# Patient Record
Sex: Female | Born: 1975 | Race: Black or African American | Hispanic: No | Marital: Married | Smoking: Never smoker
Health system: Southern US, Community
[De-identification: ages and names within clinical notes are randomized; demographics above are authoritative.]

---

## 2017-11-20 ENCOUNTER — Encounter (HOSPITAL_COMMUNITY): Payer: Self-pay | Admitting: Emergency Medicine

## 2017-11-20 ENCOUNTER — Other Ambulatory Visit: Payer: Self-pay

## 2017-11-20 ENCOUNTER — Emergency Department (HOSPITAL_COMMUNITY)
Admission: EM | Admit: 2017-11-20 | Discharge: 2017-11-20 | Disposition: A | Discharge status: Home / Self Care | Payer: Self-pay | Attending: Emergency Medicine | Admitting: Emergency Medicine

## 2017-11-20 DIAGNOSIS — J0110 Acute frontal sinusitis, unspecified: Secondary | ICD-10-CM | POA: Insufficient documentation

## 2017-11-20 LAB — GROUP A STREP BY PCR: Group A Strep by PCR: NOT DETECTED

## 2017-11-20 MED ORDER — AMOXICILLIN-POT CLAVULANATE 875-125 MG PO TABS: 1.0000 | ORAL_TABLET | Freq: Two times a day (BID) | ORAL | 0 refills | Status: DC

## 2017-11-20 NOTE — ED Triage Notes (Signed)
Patient complains of headache, left ear ache, and sinus congestion x2 weeks. Patient report she has taken multiple OTC sinus medications without relief. Patient alert and oriented and in no apparent distress at this time.

## 2017-11-20 NOTE — ED Provider Notes (Signed)
[facility] EMERGENCY DEPARTMENT Provider Note   CSN: [id number] Arrival date & time: 11/20/17  1405     History   Chief Complaint Chief Complaint  Patient presents with  . URI    HPI Margaret Duncan is a 42 y.o. female who presents with URI symptoms.  No significant past medical history.  She states that she has had intermittent headaches, sore throat, bilateral ear pain, nasal congestion and runny nose for the past 2 weeks.  She does work at a daycare.  She is concerned she may have strep throat.  She has been taking over the counter medicines without significant relief.  She has had to leave work early several days because she does not feel well.  She also reports a cough.  She denies fever, shortness of breath, nausea or vomiting  HPI  History reviewed. No pertinent past medical history.  There are no active problems to display for this patient.   History reviewed. No pertinent surgical history.   OB History   None      Home Medications    Prior to Admission medications   Not on File    Family History No family history on file.  Social History Social History   Tobacco Use  . Smoking status: Not on file  Substance Use Topics  . Alcohol use: Not on file  . Drug use: Not on file     Allergies   Patient has no known allergies.   Review of Systems Review of Systems  Constitutional: Positive for fatigue. Negative for fever.  HENT: Positive for congestion, ear pain, rhinorrhea and sore throat.   Respiratory: Positive for cough. Negative for shortness of breath.      Physical Exam Updated Vital Signs BP 106/73 (BP Location: Right Arm)   Pulse 89   Temp 99.5 F (37.5 C) (Oral)   Resp 16   LMP  (Within Weeks)   SpO2 100%   Physical Exam  Constitutional: She is oriented to person, place, and time. She appears well-developed and well-nourished. No distress.  HENT:  Head: Normocephalic and atraumatic.  Right Ear: Hearing,  tympanic membrane, external ear and ear canal normal.  Left Ear: Hearing, tympanic membrane, external ear and ear canal normal.  Nose: Mucosal edema present.  Mouth/Throat: Uvula is midline and mucous membranes are normal. Posterior oropharyngeal erythema present. No posterior oropharyngeal edema.  Eyes: Pupils are equal, round, and reactive to light. Conjunctivae are normal. Right eye exhibits no discharge. Left eye exhibits no discharge. No scleral icterus.  Neck: Normal range of motion.  Cardiovascular: Normal rate and regular rhythm.  Pulmonary/Chest: Breath sounds normal. She is in respiratory distress.  Abdominal: She exhibits no distension.  Neurological: She is alert and oriented to person, place, and time.  Skin: Skin is warm and dry.  Psychiatric: She has a normal mood and affect. Her behavior is normal.  Nursing note and vitals reviewed.    ED Treatments / Results  Labs (all labs ordered are listed, but only abnormal results are displayed) Labs Reviewed  GROUP A STREP BY PCR    EKG None  Radiology No results found.  Procedures Procedures (including critical care time)  Medications Ordered in ED Medications - No data to display   Initial Impression / Assessment and Plan / ED Course  I have reviewed the triage vital signs and the nursing notes.  Pertinent labs & imaging results that were available during my care of the patient were reviewed by  me and considered in my medical decision making (see chart for details).  42 year old female presents with URI symptoms and sinus congestion for the past 2 weeks.  Her vital signs are normal.  She is overall well-appearing.  Her strep is negative. Will prescribe Augmentin due to her duration of symptoms and advised to continue over-the-counter medicines.  Return precautions were given.  Final Clinical Impressions(s) / ED Diagnoses   Final diagnoses:  None    ED Discharge Orders    None       Bethel Born,  PA-C 11/20/17 1756    Loren Racer, MD 11/24/17 1310

## 2017-11-20 NOTE — Discharge Instructions (Signed)
Take Augmentin with food for the next week.  Continue OTC cough/cold medicines Return if worsening

## 2017-11-20 NOTE — ED Notes (Signed)
Pt verbalized understanding discharge instructions and denies any further needs or questions at this time. VS stable, ambulatory and steady gait.   

## 2019-03-16 ENCOUNTER — Other Ambulatory Visit: Payer: Self-pay | Admitting: Obstetrics & Gynecology

## 2019-03-16 DIAGNOSIS — N6310 Unspecified lump in the right breast, unspecified quadrant: Secondary | ICD-10-CM

## 2019-03-23 ENCOUNTER — Other Ambulatory Visit: Payer: Self-pay

## 2019-03-23 ENCOUNTER — Ambulatory Visit
Admission: RE | Admit: 2019-03-23 | Discharge: 2019-03-23 | Disposition: A | Discharge status: Home / Self Care | Payer: Managed Care, Other (non HMO) | Source: Ambulatory Visit | Attending: Obstetrics & Gynecology | Admitting: Obstetrics & Gynecology

## 2019-03-23 DIAGNOSIS — N6310 Unspecified lump in the right breast, unspecified quadrant: Secondary | ICD-10-CM

## 2019-09-01 ENCOUNTER — Ambulatory Visit: Payer: Medicaid Other | Attending: Internal Medicine

## 2019-09-01 DIAGNOSIS — Z23 Encounter for immunization: Secondary | ICD-10-CM

## 2019-09-01 NOTE — Progress Notes (Signed)
   Covid-19 Vaccination Clinic  Name:  Margaret Duncan    MRN: [id number] DOB: 13-Feb-1976  09/01/2019  [name] was observed post Covid-19 immunization for 15 minutes without incidence. She was provided with Vaccine Information Sheet and instruction to access the V-Safe system.   [name] was instructed to call 911 with any severe reactions post vaccine: Marland Kitchen Difficulty breathing  . Swelling of your face and throat  . A fast heartbeat  . A bad rash all over your body  . Dizziness and weakness    Immunizations Administered    Name Date Dose VIS Date Route   Pfizer COVID-19 Vaccine 09/01/2019 10:02 AM 0.3 mL 06/15/2019 Intramuscular   Manufacturer: [employer], [employer]   Lot: [id number]   NDC: [address]-3

## 2019-09-22 ENCOUNTER — Ambulatory Visit: Payer: Medicaid Other

## 2019-09-25 ENCOUNTER — Ambulatory Visit: Payer: Medicaid Other | Attending: Internal Medicine

## 2019-09-25 DIAGNOSIS — Z23 Encounter for immunization: Secondary | ICD-10-CM

## 2019-09-25 NOTE — Progress Notes (Signed)
   Covid-19 Vaccination Clinic  Name:  Carrye Goller    MRN: [id number] DOB: Dec 08, 1975  09/25/2019  Ms. Brazel was observed post Covid-19 immunization for 15 minutes without incident. She was provided with Vaccine Information Sheet and instruction to access the V-Safe system.   Ms. Lampkins was instructed to call 911 with any severe reactions post vaccine: Marland Kitchen Difficulty breathing  . Swelling of face and throat  . A fast heartbeat  . A bad rash all over body  . Dizziness and weakness   Immunizations Administered    Name Date Dose VIS Date Route   Pfizer COVID-19 Vaccine 09/25/2019  4:57 PM 0.3 mL 06/15/2019 Intramuscular   Manufacturer: [employer], [employer]   Lot: [id number]   NDC: [address]-6

## 2019-09-26 ENCOUNTER — Ambulatory Visit: Payer: Medicaid Other

## 2019-10-02 ENCOUNTER — Telehealth: Payer: Self-pay

## 2019-10-02 ENCOUNTER — Other Ambulatory Visit: Payer: Self-pay

## 2019-10-02 ENCOUNTER — Emergency Department (HOSPITAL_COMMUNITY)
Admission: EM | Admit: 2019-10-02 | Discharge: 2019-10-02 | Disposition: A | Discharge status: Home / Self Care | Payer: BC Managed Care – PPO | Attending: Emergency Medicine | Admitting: Emergency Medicine

## 2019-10-02 ENCOUNTER — Encounter (HOSPITAL_COMMUNITY): Payer: Self-pay | Admitting: Emergency Medicine

## 2019-10-02 DIAGNOSIS — E860 Dehydration: Secondary | ICD-10-CM | POA: Diagnosis not present

## 2019-10-02 DIAGNOSIS — R197 Diarrhea, unspecified: Secondary | ICD-10-CM | POA: Insufficient documentation

## 2019-10-02 DIAGNOSIS — R110 Nausea: Secondary | ICD-10-CM | POA: Insufficient documentation

## 2019-10-02 DIAGNOSIS — R519 Headache, unspecified: Secondary | ICD-10-CM | POA: Diagnosis not present

## 2019-10-02 LAB — CBC WITH DIFFERENTIAL/PLATELET
Abs Immature Granulocytes: 0.04 10*3/uL (ref 0.00–0.07)
Basophils Absolute: 0.1 10*3/uL (ref 0.0–0.1)
Basophils Relative: 0 %
Eosinophils Absolute: 0.3 10*3/uL (ref 0.0–0.5)
Eosinophils Relative: 3 %
HCT: 38.7 % (ref 36.0–46.0)
Hemoglobin: 12.5 g/dL (ref 12.0–15.0)
Immature Granulocytes: 0 %
Lymphocytes Relative: 35 %
Lymphs Abs: 4 10*3/uL (ref 0.7–4.0)
MCH: 30.3 pg (ref 26.0–34.0)
MCHC: 32.3 g/dL (ref 30.0–36.0)
MCV: 93.9 fL (ref 80.0–100.0)
Monocytes Absolute: 0.7 10*3/uL (ref 0.1–1.0)
Monocytes Relative: 7 %
Neutro Abs: 6.2 10*3/uL (ref 1.7–7.7)
Neutrophils Relative %: 55 %
Platelets: 251 10*3/uL (ref 150–400)
RBC: 4.12 MIL/uL (ref 3.87–5.11)
RDW: 13.2 % (ref 11.5–15.5)
WBC: 11.4 10*3/uL — ABNORMAL HIGH (ref 4.0–10.5)
nRBC: 0 % (ref 0.0–0.2)

## 2019-10-02 LAB — URINALYSIS, ROUTINE W REFLEX MICROSCOPIC
Glucose, UA: NEGATIVE mg/dL
Ketones, ur: 20 mg/dL — AB
Leukocytes,Ua: NEGATIVE
Nitrite: NEGATIVE
Protein, ur: 100 mg/dL — AB
Specific Gravity, Urine: 1.035 — ABNORMAL HIGH (ref 1.005–1.030)
pH: 5 (ref 5.0–8.0)

## 2019-10-02 LAB — BASIC METABOLIC PANEL
Anion gap: 12 (ref 5–15)
BUN: 7 mg/dL (ref 6–20)
CO2: 23 mmol/L (ref 22–32)
Calcium: 9.8 mg/dL (ref 8.9–10.3)
Chloride: 103 mmol/L (ref 98–111)
Creatinine, Ser: 0.8 mg/dL (ref 0.44–1.00)
GFR calc Af Amer: >60 mL/min (ref >60)
GFR calc non Af Amer: >60 mL/min (ref >60)
Glucose, Bld: 110 mg/dL — ABNORMAL HIGH (ref 70–99)
Potassium: 3.7 mmol/L (ref 3.5–5.1)
Sodium: 138 mmol/L (ref 135–145)

## 2019-10-02 LAB — I-STAT BETA HCG BLOOD, ED (MC, WL, AP ONLY): I-stat hCG, quantitative: <5 m[IU]/mL (ref <5)

## 2019-10-02 MED ORDER — DICYCLOMINE HCL 20 MG PO TABS: 20.0000 mg | ORAL_TABLET | Freq: Two times a day (BID) | ORAL | 0 refills | Status: DC | PRN

## 2019-10-02 MED ORDER — ONDANSETRON 4 MG PO TBDP: 4.0000 mg | ORAL_TABLET | Freq: Three times a day (TID) | ORAL | 0 refills | Status: DC | PRN

## 2019-10-02 MED ORDER — DICYCLOMINE HCL 10 MG PO CAPS
10.0000 mg | ORAL_CAPSULE | Freq: Once | ORAL | Status: AC
  Administered 2019-10-02: 06:00:00 10 mg via ORAL
  Filled 2019-10-02: qty 1

## 2019-10-02 MED ORDER — ONDANSETRON HCL 4 MG/2ML IJ SOLN
4.0000 mg | Freq: Once | INTRAMUSCULAR | Status: AC
  Administered 2019-10-02: 4 mg via INTRAVENOUS
  Filled 2019-10-02: qty 2

## 2019-10-02 MED ORDER — LACTATED RINGERS IV BOLUS
1000.0000 mL | Freq: Once | INTRAVENOUS | Status: AC
  Administered 2019-10-02: 1000 mL via INTRAVENOUS

## 2019-10-02 NOTE — ED Triage Notes (Signed)
Patient reports poor appetite , fatigue , low energy and headache this week , currently taking oral antibiotic post dental extraction . Denies fever or chills .

## 2019-10-02 NOTE — Telephone Encounter (Signed)
Pharmacy called to clarify prescription of zofran written by Dr Clayborne Dana. Clarification from notes zofran Q 8 h PRN

## 2019-10-03 LAB — URINE CULTURE: Culture: NO GROWTH

## 2019-10-03 NOTE — ED Provider Notes (Signed)
[facility] EMERGENCY DEPARTMENT Provider Note   CSN: [id number] Arrival date & time: 10/02/19  0126     History Chief Complaint  Patient presents with  . Loss of appetite/Fatigue/Headache    Abimbola Aki is a 44 y.o. female.  Patient with a recent dental surgery and since that time she has been Augmentin.  She states that she has had a decreased appetite, nausea some diarrhea.  She this is caused her to have headaches as well.  She feels like she might be dehydrated as her mouth seems to be dry.  She never had Augmentin before that she knows of.  She states that the dental pain seems to have improved but she does have intermittent headaches.  Has not tried thing at home for the symptoms.  Has abdominal cramping but no focal pain.        History reviewed. No pertinent past medical history.  There are no problems to display for this patient.   History reviewed. No pertinent surgical history.   OB History   No obstetric history on file.     Family History  Problem Relation Age of Onset  . Breast cancer Paternal Aunt   . Breast cancer Paternal Grandmother   . Breast cancer Paternal Aunt     Social History   Tobacco Use  . Smoking status: Never Smoker  . Smokeless tobacco: Never Used  Substance Use Topics  . Alcohol use: Never  . Drug use: Never    Home Medications Prior to Admission medications   Medication Sig Start Date End Date Taking? Authorizing Provider  amoxicillin-clavulanate (AUGMENTIN) 875-125 MG tablet Take 1 tablet by mouth 2 (two) times daily. 09/28/19  Yes [provider]  chlorhexidine (PERIDEX) 0.12 % solution 15 mLs by Mouth Rinse route 2 (two) times daily. X 7 days 09/28/19  Yes [provider]  HYDROcodone-acetaminophen (NORCO) 7.5-325 MG tablet Take 1 tablet by mouth every 4 (four) hours as needed for pain. 09/28/19  Yes [provider]  ibuprofen (ADVIL) 800 MG tablet Take 800 mg by mouth every 8  (eight) hours as needed for pain. 09/28/19  Yes [provider]  amoxicillin-clavulanate (AUGMENTIN) 875-125 MG tablet Take 1 tablet by mouth every 12 (twelve) hours. Patient not taking: Reported on 10/02/2019 11/20/17   Bethel Born, PA-C  dicyclomine (BENTYL) 20 MG tablet Take 1 tablet (20 mg total) by mouth 2 (two) times daily as needed for spasms (abdominal cramping). 10/02/19   Vashawn Ekstein, Barbara Cower, MD  ondansetron (ZOFRAN ODT) 4 MG disintegrating tablet Take 1 tablet (4 mg total) by mouth every 8 (eight) hours as needed. 4mg  ODT q4 hours prn nausea/vomit 10/02/19   Woodrow Drab, 10/04/19, MD    Allergies    Patient has no known allergies.  Review of Systems   Review of Systems  All other systems reviewed and are negative.   Physical Exam Updated Vital Signs BP (!) 103/58   Pulse 70   Temp 97.6 F (36.4 C) (Oral)   Resp 18   Ht 5\' 3"  (1.6 m)   Wt 55 kg   LMP 09/25/2019   SpO2 100%   BMI 21.48 kg/m   Physical Exam Vitals and nursing note reviewed.  Constitutional:      Appearance: She is well-developed.  HENT:     Head: Normocephalic and atraumatic.     Mouth/Throat:     Mouth: Mucous membranes are dry.     Pharynx: Oropharynx is clear.  Eyes:  Conjunctiva/sclera: Conjunctivae normal.  Cardiovascular:     Rate and Rhythm: Normal rate and regular rhythm.     Heart sounds: No murmur.  Pulmonary:     Effort: No respiratory distress.     Breath sounds: No stridor.  Abdominal:     General: There is no distension.  Musculoskeletal:        General: No swelling or tenderness. Normal range of motion.     Cervical back: Normal range of motion.  Skin:    General: Skin is warm and dry.  Neurological:     General: No focal deficit present.     Mental Status: She is alert.     ED Results / Procedures / Treatments   Labs (all labs ordered are listed, but only abnormal results are displayed) Labs Reviewed  CBC WITH DIFFERENTIAL/PLATELET - Abnormal; Notable for the  following components:      Result Value   WBC 11.4 (*)    All other components within normal limits  BASIC METABOLIC PANEL - Abnormal; Notable for the following components:   Glucose, Bld 110 (*)    All other components within normal limits  URINALYSIS, ROUTINE W REFLEX MICROSCOPIC - Abnormal; Notable for the following components:   APPearance HAZY (*)    Specific Gravity, Urine 1.035 (*)    Hgb urine dipstick SMALL (*)    Bilirubin Urine SMALL (*)    Ketones, ur 20 (*)    Protein, ur 100 (*)    Bacteria, UA RARE (*)    All other components within normal limits  URINE CULTURE  I-STAT BETA HCG BLOOD, ED ([facility], [facility], [facility] ONLY)    EKG None  Radiology No results found.  Procedures Procedures (including critical care time)  Medications Ordered in ED Medications  ondansetron (ZOFRAN) injection 4 mg (4 mg Intravenous Given 10/02/19 0617)  lactated ringers bolus 1,000 mL (0 mLs Intravenous Stopped 10/02/19 0741)  dicyclomine (BENTYL) capsule 10 mg (10 mg Oral Given 10/02/19 0617)    ED Course  I have reviewed the triage vital signs and the nursing notes.  Pertinent labs & imaging results that were available during my care of the patient were reviewed by me and considered in my medical decision making (see chart for details).    MDM Rules/Calculators/A&P                      Suspect that this is side effect of the Augmentin.  She was given some fluids here she did seem to be a little bit dehydrated with a high specific gravity and urine.  No indication for imaging is without focal tenderness of her abdomen I doubt appendicitis, obstruction, colitis or other significant intra-abdominal emergency.  Plan for rehydration and discharge. Continue augmentin with meals, zofran and bentyl 1/2 hour prior.   Final Clinical Impression(s) / ED Diagnoses Final diagnoses:  Dehydration    Rx / DC Orders ED Discharge Orders         Ordered    dicyclomine (BENTYL) 20 MG tablet  2 times daily PRN      10/02/19 0647    ondansetron (ZOFRAN ODT) 4 MG disintegrating tablet  Every 8 hours PRN     10/02/19 0647           Cosby Proby, Corene Cornea, MD 10/03/19 6948

## 2019-10-15 DIAGNOSIS — R1084 Generalized abdominal pain: Secondary | ICD-10-CM | POA: Diagnosis not present

## 2019-10-15 DIAGNOSIS — K5900 Constipation, unspecified: Secondary | ICD-10-CM | POA: Diagnosis not present

## 2019-10-31 ENCOUNTER — Ambulatory Visit (INDEPENDENT_AMBULATORY_CARE_PROVIDER_SITE_OTHER): Payer: BC Managed Care – PPO | Admitting: Primary Care

## 2019-10-31 ENCOUNTER — Other Ambulatory Visit: Payer: Self-pay

## 2019-10-31 ENCOUNTER — Encounter (INDEPENDENT_AMBULATORY_CARE_PROVIDER_SITE_OTHER): Payer: Self-pay | Admitting: Primary Care

## 2019-10-31 VITALS — BP 112/66 | HR 104 | Temp 97.5°F | Ht 63.0 in | Wt 112.6 lb

## 2019-10-31 DIAGNOSIS — Z23 Encounter for immunization: Secondary | ICD-10-CM

## 2019-10-31 DIAGNOSIS — Z7689 Persons encountering health services in other specified circumstances: Secondary | ICD-10-CM

## 2019-10-31 DIAGNOSIS — F5082 Avoidant/restrictive food intake disorder: Secondary | ICD-10-CM | POA: Diagnosis not present

## 2019-10-31 DIAGNOSIS — K5900 Constipation, unspecified: Secondary | ICD-10-CM

## 2019-10-31 MED ORDER — SENNA 8.6 MG PO TABS: 1.0000 | ORAL_TABLET | Freq: Every day | ORAL | 0 refills | Status: DC | PRN

## 2019-10-31 NOTE — Patient Instructions (Addendum)
Anorexia Nervosa Anorexia nervosa is an eating disorder that results in a lower-than-normal body weight. The disorder usually starts in the teenage years. People with anorexia nervosa are intensely afraid of gaining weight or being fat. They often see themselves as fat even though they may be very thin. To prevent weight gain or to lose weight, they engage in unhealthy behaviors, such as:  Starving themselves.  Fasting.  Exercising too much.  Trying to get rid of food they have eaten (purging), such as by: ? Making themselves throw up (vomit) after eating. ? Using laxatives or enemas. These behaviors often interfere with normal life activities. They can lead to serious medical problems and even death. People with anorexia nervosa are also at risk for substance abuse and death due to suicide. What are the causes? Common causes of this condition include:  Depression and other psychological problems.  Pressure from peers and society to have a thin body.  Hormone changes at puberty.  Stress.  Factors that are inherited from family (genetics). What increases the risk? The following factors may make you more likely to develop this condition:  Being a teenager.  Being female. Anorexia nervosa can affect males, but it is more common in females.  Having a mental health disorder, such as depression or anxiety.  Having a family member who has an eating disorder.  Participating in sports or activities that put an emphasis on weight and appearance, such as dancing, cheerleading, running, ice-skating, gymnastics, wrestling, or modeling.  Feeling anxious or tending to be obsessive, even as a young child.  Thinking a lot about being perfect and following rules. What are the signs or symptoms? Symptoms of this condition include:  Changes in appearance. These may include hair loss, dry hair and skin, spotty skin, brittle nails, and a thin layer of hair covering the skin  (lanugo).  Discolored teeth or cavities.  Loss of muscle and fat.  Lower-than-normal body weight.  Low blood pressure.  Slow heart rate.  Feeling cold all the time.  Fatigue.  Constipation.  Missed menstrual periods. Other symptoms include:  An intense fear of gaining weight or being fat.  Having a distorted body image, such as thinking that you are fat when you are not.  Basing your self-worth on being thin.  Wearing lots of clothes to hide your body.  Being in denial that your low body weight is a problem.  Eating in secret.  Binge eating.  Checking your body frequently by: ? Looking in a mirror. ? Pinching the skin on the sides of your body. ? Weighing yourself.  Doing things that prevent weight gain, such as: ? Limiting (restricting) your calorie intake, starving yourself, or not eating for a long period of time (fasting). ? Purging. ? Exercising excessively. How is this diagnosed? This condition may be diagnosed based on:  An assessment by your health care provider. He or she may ask about: ? Your thoughts, feelings, and eating habits. ? Your symptoms. ? Your use of medicine, alcohol, or other substances.  Measurements of your weight.  Checking your body temperature, pulse, blood pressure, and breathing rate (vital signs).  Results of a physical exam. Your health care provider may also order tests or studies to look for health problems. You may be referred to a mental health specialist for evaluation. After you have been diagnosed, your level of anorexia nervosa will be rated from mild to severe. The rating depends on your degree of weight loss compared to other people  of your age, gender, and stage of development. How is this treated?  The first goal of treatment is to stabilize medical problems and mental health issues that are related to the disorder. The type and length of treatment will depend on your specific situation. You may need to be  treated at the hospital if you have:  Severe malnutrition.  Dehydration.  An imbalance in important chemicals (electrolytes) in your body.  An abnormal heart rhythm.  Depression.  Suicidal thoughts. The second goal of treatment is to restore your body weight to a healthy level. Successful treatment usually requires a combination of:  Close monitoring of weight and feeding (behavioral feeding plan) to increase your calorie intake.  Counseling with a diet and nutrition specialist (dietitian).  Talk therapy or counseling with a mental health specialist. A form of talk therapy called cognitive behavioral therapy (CBT) can be especially helpful. This therapy helps you recognize the thoughts, beliefs, and emotions that lead to unhealthy eating habits and helps you change them.  Medicines. Some people with severe anorexia nervosa may benefit from certain medicines that help them to gain weight. Antidepressant medicines can help with symptoms of depression and anxiety. For adolescents, Family-Based Treatment (FBT) may also be helpful. This is a form of therapy in which your family is invited to have an active role in your treatment and recovery. Follow these instructions at home:  Take over-the-counter and prescription medicines only as told by your health care provider.  Follow a meal plan as directed by your health care provider.  Avoid checking your weight.  Avoid isolating yourself from your family and friends, especially during mealtimes.  Keep all follow-up visits as told by your health care provider. This is important. Where to find more information  National Eating Disorders Association (NEDA): www.nationaleatingdisorders.[place] on Mental Illness: www.nami.org  [place] Department of Health and Human Services: http://greene.com/ Contact a health care provider if:  Your symptoms get worse.  You start having new symptoms. Get help right away if:  You fall  down (collapse) or lose consciousness (faint) because of exhaustion or malnutrition.  You have chest pain or abnormal heart rhythms.  You vomit blood.  You have serious thoughts about hurting yourself or someone else. If you ever feel like you may hurt yourself or others, or have thoughts about taking your own life, get help right away. You can go to your nearest emergency department or call:  Your local emergency services (911 in the [place]  A suicide crisis helpline, such as the [facility] at 929-261-1460. This is open 24 hours a day. Summary  Anorexia nervosa is a serious condition. Low body weight is its main symptom.  People who have anorexia nervosa often think that they are overweight (body image disturbance). They may even try to lose weight by denying themselves food, try to get rid of the food they have eaten (purge), or use certain medicines to prevent weight gain.  If anorexia nervosa is not treated, it can lead to death.  Effective treatments usually involve close monitoring of weight and feeding (behavioral feeding plans) and cognitive behavioral interventions. In some cases, treatment may include medicines. This information is not intended to replace advice given to you by your health care provider. Make sure you discuss any questions you have with your health care provider. Document Revised: 06/03/2017 Document Reviewed: 02/16/2017 Elsevier Patient Education  2020 [employer]. Fiber Chart  You should 25-30g of fiber per day and drinking  8 glasses of water to help your bowels move regularly.  In the chart below you can look up how much fiber you are getting in an average day.  If you are not getting enough fiber, you should add a fiber supplement to your diet.  Examples of this include Metamucil, FiberCon and Citrucel.  These can be purchased at your local grocery store or pharmacy.       LimitLaws.com.cy.pdf https://www.cdc.gov/vaccines/hcp/vis/vis-statements/tdap.pdf">  Tdap (Tetanus, Diphtheria, Pertussis) Vaccine: What You Need to Know 1. Why get vaccinated? Tdap vaccine can prevent tetanus, diphtheria, and pertussis. Diphtheria and pertussis spread from person to person. Tetanus enters the body through cuts or wounds.  TETANUS (T) causes painful stiffening of the muscles. Tetanus can lead to serious health problems, including being unable to open the mouth, having trouble swallowing and breathing, or death.  DIPHTHERIA (D) can lead to difficulty breathing, heart failure, paralysis, or death.  PERTUSSIS (aP), also known as "whooping cough," can cause uncontrollable, violent coughing which makes it hard to breathe, eat, or drink. Pertussis can be extremely serious in babies and young children, causing pneumonia, convulsions, brain damage, or death. In teens and adults, it can cause weight loss, loss of bladder control, passing out, and rib fractures from severe coughing. 2. Tdap vaccine Tdap is only for children 7 years and older, adolescents, and adults.  Adolescents should receive a single dose of Tdap, preferably at age 80 or 12 years. Pregnant women should get a dose of Tdap during every pregnancy, to protect the newborn from pertussis. Infants are most at risk for severe, life-threatening complications from pertussis. Adults who have never received Tdap should get a dose of Tdap. Also, adults should receive a booster dose every 10 years, or earlier in the case of a severe and dirty wound or burn. Booster doses can be either Tdap or Td (a different vaccine that protects against tetanus and diphtheria but not pertussis). Tdap may be given at the same time as other vaccines. 3. Talk with your health care provider Tell your vaccine provider if the person getting the vaccine:  Has had an allergic reaction after a  previous dose of any vaccine that protects against tetanus, diphtheria, or pertussis, or has any severe, life-threatening allergies.  Has had a coma, decreased level of consciousness, or prolonged seizures within 7 days after a previous dose of any pertussis vaccine (DTP, DTaP, or Tdap).  Has seizures or another nervous system problem.  Has ever had Guillain-Barr Syndrome (also called GBS).  Has had severe pain or swelling after a previous dose of any vaccine that protects against tetanus or diphtheria. In some cases, your health care provider may decide to postpone Tdap vaccination to a future visit.  People with minor illnesses, such as a cold, may be vaccinated. People who are moderately or severely ill should usually wait until they recover before getting Tdap vaccine.  Your health care provider can give you more information. 4. Risks of a vaccine reaction  Pain, redness, or swelling where the shot was given, mild fever, headache, feeling tired, and nausea, vomiting, diarrhea, or stomachache sometimes happen after Tdap vaccine. People sometimes faint after medical procedures, including vaccination. Tell your provider if you feel dizzy or have vision changes or ringing in the ears.  As with any medicine, there is a very remote chance of a vaccine causing a severe allergic reaction, other serious injury, or death. 5. What if there is a serious problem? An allergic reaction could occur after  the vaccinated person leaves the clinic. If you see signs of a severe allergic reaction (hives, swelling of the face and throat, difficulty breathing, a fast heartbeat, dizziness, or weakness), call 9-1-1 and get the person to the nearest hospital. For other signs that concern you, call your health care provider.  Adverse reactions should be reported to the Vaccine Adverse Event Reporting System (VAERS). Your health care provider will usually file this report, or you can do it yourself. Visit the VAERS  website at www.vaers.LAgents.no or call 6782238292. VAERS is only for reporting reactions, and VAERS staff do not give medical advice. 6. The National Vaccine Injury Compensation Program The [employer] Vaccine Injury Compensation Program (VICP) is a federal program that was created to compensate people who may have been injured by certain vaccines. Visit the VICP website at SpiritualWord.at or call (530)637-3640 to learn about the program and about filing a claim. There is a time limit to file a claim for compensation. 7. How can I learn more?  Ask your health care provider.  Call your local or state health department.  Contact the Centers for Disease Control and Prevention (CDC): ? Call (680)067-2967 (1-800-CDC-INFO) or ? Visit CDC's website at PicCapture.uy Vaccine Information Statement Tdap (Tetanus, Diphtheria, Pertussis) Vaccine (10/04/2018) This information is not intended to replace advice given to you by your health care provider. Make sure you discuss any questions you have with your health care provider. Document Revised: 10/13/2018 Document Reviewed: 10/16/2018 Elsevier Patient Education  2020 [employer].

## 2019-10-31 NOTE — Progress Notes (Signed)
New Patient Office Visit  Subjective:  Patient ID: Margaret Duncan, female    DOB: [birth year]  Age: 44 y.o. MRN: [id number]  CC:  Chief Complaint  Patient presents with  . New Patient (Initial Visit)    right leg pain  . Constipation  . Anemia  . Anorexia    HPI Ms. Sylwia Cuervo is a 44 year old African American female with no significant past medical history. She was seen in the emergency room 10/02/19 after dental surgery she was prescribed  Augmentin.   She had  decreased appetite,(previous but worst) nausea some diarrhea and   headaches. Presents to establish care. Continues to have no appetite , forces self to eat and with eating  No past medical history on file.  No past surgical history on file.  Family History  Problem Relation Age of Onset  . Breast cancer Paternal Aunt   . Breast cancer Paternal Grandmother   . Breast cancer Paternal Aunt     Social History   Socioeconomic History  . Marital status: Married    Spouse name: Not on file  . Number of children: Not on file  . Years of education: Not on file  . Highest education level: Not on file  Occupational History  . Not on file  Tobacco Use  . Smoking status: Never Smoker  . Smokeless tobacco: Never Used  Substance and Sexual Activity  . Alcohol use: Never  . Drug use: Never  . Sexual activity: Not on file  Other Topics Concern  . Not on file  Social History Narrative  . Not on file   Social Determinants of Health   Financial Resource Strain:   . Difficulty of Paying Living Expenses:   Food Insecurity:   . Worried About Programme researcher, broadcasting/film/video in the Last Year:   . Barista in the Last Year:   Transportation Needs:   . Freight forwarder (Medical):   Marland Kitchen Lack of Transportation (Non-Medical):   Physical Activity:   . Days of Exercise per Week:   . Minutes of Exercise per Session:   Stress:   . Feeling of Stress :   Social Connections:   . Frequency of Communication with Friends  and Family:   . Frequency of Social Gatherings with Friends and Family:   . Attends Religious Services:   . Active Member of Clubs or Organizations:   . Attends Banker Meetings:   Marland Kitchen Marital Status:   Intimate Partner Violence:   . Fear of Current or Ex-Partner:   . Emotionally Abused:   Marland Kitchen Physically Abused:   . Sexually Abused:     ROS Review of Systems  Constitutional: Positive for appetite change and fatigue.    Objective:   Today's Vitals: BP 112/66 (BP Location: Left Arm, Patient Position: Sitting, Cuff Size: Normal)   Pulse (!) 104   Temp (!) 97.5 F (36.4 C) (Temporal)   Ht 5\' 3"  (1.6 m)   Wt 112 lb 9.6 oz (51.1 kg)   LMP 10/16/2019 (Exact Date)   SpO2 98%   BMI 19.95 kg/m   Physical Exam Vitals reviewed.  Constitutional:      Appearance: Normal appearance.  HENT:     Head: Normocephalic.     Right Ear: Tympanic membrane normal.     Left Ear: Tympanic membrane normal.  Eyes:     Extraocular Movements: Extraocular movements intact.     Pupils: Pupils are equal, round, and reactive to  light.  Cardiovascular:     Rate and Rhythm: Normal rate and regular rhythm.     Pulses: Normal pulses.     Heart sounds: Normal heart sounds.  Pulmonary:     Effort: Pulmonary effort is normal.     Breath sounds: Normal breath sounds.  Abdominal:     General: Abdomen is flat. Bowel sounds are normal.  Musculoskeletal:        General: Normal range of motion.     Cervical back: Normal range of motion and neck supple.  Skin:    General: Skin is warm and dry.  Neurological:     Mental Status: She is alert and oriented to person, place, and time.  Psychiatric:        Mood and Affect: Mood normal.        Behavior: Behavior normal.        Thought Content: Thought content normal.        Judgment: Judgment normal.     Assessment & Plan:  Darci was seen today for new patient (initial visit), constipation, anemia and anorexia.  Diagnoses and all orders for  this visit:  Encounter to establish care Juluis Mire, NP-C will be your  (PCP) she is mastered prepared . Able to diagnosed and treatment also  answer health concern as well as continuing care of varied medical conditions, not limited by cause, organ system, or diagnosis.   Need for Tdap vaccination -     Tdap vaccine greater than or equal to 7yo IM  Avoidant-restrictive food intake disorder (ARFID) Patient has lost 40 lbs in the last 9 months first was intentional  weight loss now it has become a problem where she has no desire or interest to eat. Discussed diagnosis of anorexia she was not please with her weight prior too. Now in my opinion she is fearing the food she eats will cause the weight gain. Encourage to gain 5-8 pounds in the next  2 month or she will need to be evaluated if this behavior continues.  Constipation, unspecified constipation type On AVS provided foods high in fiber. We also discussed nothing in nothing out. Increasing water 64 oz daily.sent in laxative. She does not she has had constipation problem previously.   Other orders -     senna (SENOKOT) 8.6 MG TABS tablet; Take 1 tablet (8.6 mg total) by mouth daily as needed for mild constipation.     Outpatient Encounter Medications as of 10/31/2019  Medication Sig  . senna (SENOKOT) 8.6 MG TABS tablet Take 1 tablet (8.6 mg total) by mouth daily as needed for mild constipation.  . [DISCONTINUED] amoxicillin-clavulanate (AUGMENTIN) 875-125 MG tablet Take 1 tablet by mouth every 12 (twelve) hours. (Patient not taking: Reported on 10/02/2019)  . [DISCONTINUED] amoxicillin-clavulanate (AUGMENTIN) 875-125 MG tablet Take 1 tablet by mouth 2 (two) times daily.  . [DISCONTINUED] chlorhexidine (PERIDEX) 0.12 % solution 15 mLs by Mouth Rinse route 2 (two) times daily. X 7 days  . [DISCONTINUED] dicyclomine (BENTYL) 20 MG tablet Take 1 tablet (20 mg total) by mouth 2 (two) times daily as needed for spasms (abdominal cramping).   . [DISCONTINUED] HYDROcodone-acetaminophen (NORCO) 7.5-325 MG tablet Take 1 tablet by mouth every 4 (four) hours as needed for pain.  . [DISCONTINUED] ibuprofen (ADVIL) 800 MG tablet Take 800 mg by mouth every 8 (eight) hours as needed for pain.  . [DISCONTINUED] ondansetron (ZOFRAN ODT) 4 MG disintegrating tablet Take 1 tablet (4 mg total) by mouth every 8 (eight)  hours as needed. 4mg  ODT q4 hours prn nausea/vomit   No facility-administered encounter medications on file as of 10/31/2019.    Follow-up: Return in about 2 months (around 12/31/2019) for weight follow up .   01/02/2020, NP

## 2019-12-31 ENCOUNTER — Other Ambulatory Visit: Payer: Self-pay

## 2019-12-31 ENCOUNTER — Encounter (INDEPENDENT_AMBULATORY_CARE_PROVIDER_SITE_OTHER): Payer: Self-pay | Admitting: Primary Care

## 2019-12-31 ENCOUNTER — Ambulatory Visit (INDEPENDENT_AMBULATORY_CARE_PROVIDER_SITE_OTHER): Payer: BC Managed Care – PPO | Admitting: Primary Care

## 2019-12-31 VITALS — BP 94/59 | HR 103 | Temp 97.3°F | Resp 16

## 2019-12-31 DIAGNOSIS — G4700 Insomnia, unspecified: Secondary | ICD-10-CM

## 2019-12-31 DIAGNOSIS — I959 Hypotension, unspecified: Secondary | ICD-10-CM

## 2019-12-31 DIAGNOSIS — F5082 Avoidant/restrictive food intake disorder: Secondary | ICD-10-CM | POA: Diagnosis not present

## 2019-12-31 DIAGNOSIS — F411 Generalized anxiety disorder: Secondary | ICD-10-CM

## 2019-12-31 MED ORDER — ENSURE ACTIVE HIGH PROTEIN PO LIQD: 8.0000 [oz_av] | Freq: Two times a day (BID) | ORAL | 11 refills | Status: DC

## 2019-12-31 MED ORDER — TRAZODONE HCL 50 MG PO TABS: 50.0000 mg | ORAL_TABLET | Freq: Every day | ORAL | 0 refills | Status: DC

## 2019-12-31 NOTE — Progress Notes (Signed)
Unable to sleep x several months

## 2019-12-31 NOTE — Progress Notes (Signed)
Acute Office Visit  Subjective:    Patient ID: Margaret Duncan, female    DOB: 1975-09-17, 44 y.o.   MRN: [id number]  No chief complaint on file.   HPI Margaret Duncan is a 44 year old female who presents in today for insomnia . She only get 2 good hours of sleep and tried OTC melatonin , benadryl  and tylenol pm. She does not feel stressed. No past medical history on file.  No past surgical history on file.  Family History  Problem Relation Age of Onset  . Breast cancer Paternal Aunt   . Breast cancer Paternal Grandmother   . Breast cancer Paternal Aunt     Social History   Socioeconomic History  . Marital status: Married    Spouse name: Not on file  . Number of children: Not on file  . Years of education: Not on file  . Highest education level: Not on file  Occupational History  . Not on file  Tobacco Use  . Smoking status: Never Smoker  . Smokeless tobacco: Never Used  Substance and Sexual Activity  . Alcohol use: Never  . Drug use: Never  . Sexual activity: Not on file  Other Topics Concern  . Not on file  Social History Narrative  . Not on file   Social Determinants of Health   Financial Resource Strain:   . Difficulty of Paying Living Expenses:   Food Insecurity:   . Worried About Programme researcher, broadcasting/film/video in the Last Year:   . Barista in the Last Year:   Transportation Needs:   . Freight forwarder (Medical):   Marland Kitchen Lack of Transportation (Non-Medical):   Physical Activity:   . Days of Exercise per Week:   . Minutes of Exercise per Session:   Stress:   . Feeling of Stress :   Social Connections:   . Frequency of Communication with Friends and Family:   . Frequency of Social Gatherings with Friends and Family:   . Attends Religious Services:   . Active Member of Clubs or Organizations:   . Attends Banker Meetings:   Marland Kitchen Marital Status:   Intimate Partner Violence:   . Fear of Current or Ex-Partner:   . Emotionally  Abused:   Marland Kitchen Physically Abused:   . Sexually Abused:     Outpatient Medications Prior to Visit  Medication Sig Dispense Refill  . senna (SENOKOT) 8.6 MG TABS tablet Take 1 tablet (8.6 mg total) by mouth daily as needed for mild constipation. (Patient not taking: Reported on 12/31/2019) 120 tablet 0   No facility-administered medications prior to visit.    No Known Allergies  Review of Systems  Psychiatric/Behavioral: Positive for sleep disturbance. The patient is nervous/anxious.   All other systems reviewed and are negative.      Objective:    Physical Exam Vitals reviewed.  Constitutional:      Appearance: Normal appearance.  HENT:     Head: Normocephalic.     Right Ear: Tympanic membrane normal.     Left Ear: Tympanic membrane normal.  Eyes:     Extraocular Movements: Extraocular movements intact.     Pupils: Pupils are equal, round, and reactive to light.  Cardiovascular:     Rate and Rhythm: Normal rate and regular rhythm.     Pulses: Normal pulses.  Pulmonary:     Effort: Pulmonary effort is normal.     Breath sounds: Normal breath sounds.  Abdominal:  General: Bowel sounds are normal.  Musculoskeletal:        General: Normal range of motion.     Cervical back: Normal range of motion.  Skin:    General: Skin is warm and dry.  Neurological:     Mental Status: She is alert and oriented to person, place, and time.  Psychiatric:        Mood and Affect: Mood normal.        Behavior: Behavior normal.        Thought Content: Thought content normal.        Judgment: Judgment normal.     BP (!) 94/59   Pulse (!) 103   Temp (!) 97.3 F (36.3 C)   Resp 16   LMP 12/11/2019 (Exact Date)   SpO2 100%  Wt Readings from Last 3 Encounters:  10/31/19 112 lb 9.6 oz (51.1 kg)  10/02/19 121 lb 4.1 oz (55 kg)    Health Maintenance Due  Topic Date Due  . Hepatitis C Screening  Never done  . HIV Screening  Never done    There are no preventive care reminders to  display for this patient.   No results found for: TSH Lab Results  Component Value Date   WBC 11.4 (H) 10/02/2019   HGB 12.5 10/02/2019   HCT 38.7 10/02/2019   MCV 93.9 10/02/2019   PLT 251 10/02/2019   Lab Results  Component Value Date   NA 138 10/02/2019   K 3.7 10/02/2019   CO2 23 10/02/2019   GLUCOSE 110 (H) 10/02/2019   BUN 7 10/02/2019   CREATININE 0.80 10/02/2019   CALCIUM 9.8 10/02/2019   ANIONGAP 12 10/02/2019   No results found for: CHOL No results found for: HDL No results found for: LDLCALC No results found for: TRIG No results found for: CHOLHDL No results found for: [id number]     Assessment & Plan:  Diagnoses and all orders for this visit:  Anxiety state  We discussed options for treatment of anxiety including therapy exercising , me time  and/or medication. -     traZODone (DESYREL) 50 MG tablet; Take 1 tablet (50 mg total) by mouth at bedtime.  Insomnia, unspecified type  Insomnia Onset:3 months  Pattern:    Difficulty going to sleep: yes    Frequent awakening: yes    Early awakening: yes Nightmares: No  Abnormal leg movement: NO  Snoring:NO Apnea:NO Risk factors/sleep hygiene:    Stimulants: no    Alcohol intake: no    Reading, watching TV, eating @ bedtime:    Daytime naps: yes    Stress/anxiety: yes     Work/travel factors: no Treatment to date/efficacy:  -     traZODone (DESYREL) 50 MG tablet; Take 1 tablet (50 mg total) by mouth at bedtime.  Hypotension, unspecified hypotension type 94/59 appetite is improving but not increased fluids probable  dehydration. Strict and limited amount of food and fluids at school.   Avoidant-restrictive food intake disorder (ARFID) Protein drinks/shakes and snacks during the day appetite slowly improving.  Meds ordered this encounter  Medications  . traZODone (DESYREL) 50 MG tablet    Sig: Take 1 tablet (50 mg total) by mouth at bedtime.    Dispense:  90 tablet    Refill:  0  . Nutritional  Supplements (ENSURE ACTIVE HIGH PROTEIN) LIQD    Sig: Take 8 oz by mouth 2 (two) times daily.    Dispense:  237 mL    Refill:  11     Kerin Perna, NP

## 2019-12-31 NOTE — Patient Instructions (Signed)
Trazodone tablets What is this medicine? TRAZODONE (TRAZ oh done) is used to treat depression. This medicine may be used for other purposes; ask your health care provider or pharmacist if you have questions. COMMON BRAND NAME(S): Desyrel What should I tell my health care provider before I take this medicine? They need to know if you have any of these conditions:  attempted suicide or thinking about it  bipolar disorder  bleeding problems  glaucoma  heart disease, or previous heart attack  irregular heart beat  kidney or liver disease  low levels of sodium in the blood  an unusual or allergic reaction to trazodone, other medicines, foods, dyes or preservatives  pregnant or trying to get pregnant  breast-feeding How should I use this medicine? Take this medicine by mouth with a glass of water. Follow the directions on the prescription label. Take this medicine shortly after a meal or a light snack. Take your medicine at regular intervals. Do not take your medicine more often than directed. Do not stop taking this medicine suddenly except upon the advice of your doctor. Stopping this medicine too quickly may cause serious side effects or your condition may worsen. A special MedGuide will be given to you by the pharmacist with each prescription and refill. Be sure to read this information carefully each time. Talk to your pediatrician regarding the use of this medicine in children. Special care may be needed. Overdosage: If you think you have taken too much of this medicine contact a poison control center or emergency room at once. NOTE: This medicine is only for you. Do not share this medicine with others. What if I miss a dose? If you miss a dose, take it as soon as you can. If it is almost time for your next dose, take only that dose. Do not take double or extra doses. What may interact with this medicine? Do not take this medicine with any of the following  medications:  certain medicines for fungal infections like fluconazole, itraconazole, ketoconazole, posaconazole, voriconazole  cisapride  dronedarone  linezolid  MAOIs like Carbex, Eldepryl, Marplan, Nardil, and Parnate  mesoridazine  methylene blue (injected into a vein)  pimozide  saquinavir  thioridazine This medicine may also interact with the following medications:  alcohol  antiviral medicines for HIV or AIDS  aspirin and aspirin-like medicines  barbiturates like phenobarbital  certain medicines for blood pressure, heart disease, irregular heart beat  certain medicines for depression, anxiety, or psychotic disturbances  certain medicines for migraine headache like almotriptan, eletriptan, frovatriptan, naratriptan, rizatriptan, sumatriptan, zolmitriptan  certain medicines for seizures like carbamazepine and phenytoin  certain medicines for sleep  certain medicines that treat or prevent blood clots like dalteparin, enoxaparin, warfarin  digoxin  fentanyl  lithium  NSAIDS, medicines for pain and inflammation, like ibuprofen or naproxen  other medicines that prolong the QT interval (cause an abnormal heart rhythm) like dofetilide  rasagiline  supplements like St. John's wort, kava kava, valerian  tramadol  tryptophan This list may not describe all possible interactions. Give your health care provider a list of all the medicines, herbs, non-prescription drugs, or dietary supplements you use. Also tell them if you smoke, drink alcohol, or use illegal drugs. Some items may interact with your medicine. What should I watch for while using this medicine? Tell your doctor if your symptoms do not get better or if they get worse. Visit your doctor or health care professional for regular checks on your progress. Because it may take   several weeks to see the full effects of this medicine, it is important to continue your treatment as prescribed by your  doctor. Patients and their families should watch out for new or worsening thoughts of suicide or depression. Also watch out for sudden changes in feelings such as feeling anxious, agitated, panicky, irritable, hostile, aggressive, impulsive, severely restless, overly excited and hyperactive, or not being able to sleep. If this happens, especially at the beginning of treatment or after a change in dose, call your health care professional. You may get drowsy or dizzy. Do not drive, use machinery, or do anything that needs mental alertness until you know how this medicine affects you. Do not stand or sit up quickly, especially if you are an older patient. This reduces the risk of dizzy or fainting spells. Alcohol may interfere with the effect of this medicine. Avoid alcoholic drinks. This medicine may cause dry eyes and blurred vision. If you wear contact lenses you may feel some discomfort. Lubricating drops may help. See your eye doctor if the problem does not go away or is severe. Your mouth may get dry. Chewing sugarless gum, sucking hard candy and drinking plenty of water may help. Contact your doctor if the problem does not go away or is severe. What side effects may I notice from receiving this medicine? Side effects that you should report to your doctor or health care professional as soon as possible:  allergic reactions like skin rash, itching or hives, swelling of the face, lips, or tongue  elevated mood, decreased need for sleep, racing thoughts, impulsive behavior  confusion  fast, irregular heartbeat  feeling faint or lightheaded, falls  feeling agitated, angry, or irritable  loss of balance or coordination  painful or prolonged erections  restlessness, pacing, inability to keep still  suicidal thoughts or other mood changes  tremors  trouble sleeping  seizures  unusual bleeding or bruising Side effects that usually do not require medical attention (report to your doctor  or health care professional if they continue or are bothersome):  change in sex drive or performance  change in appetite or weight  constipation  headache  muscle aches or pains  nausea This list may not describe all possible side effects. Call your doctor for medical advice about side effects. You may report side effects to FDA at 1-800-FDA-1088. Where should I keep my medicine? Keep out of the reach of children. Store at room temperature between 15 and 30 degrees C (59 to 86 degrees F). Protect from light. Keep container tightly closed. Throw away any unused medicine after the expiration date. NOTE: This sheet is a summary. It may not cover all possible information. If you have questions about this medicine, talk to your doctor, pharmacist, or health care provider.  2020 Elsevier/Gold Standard (2018-06-13 11:46:46)  

## 2020-02-13 DIAGNOSIS — R300 Dysuria: Secondary | ICD-10-CM | POA: Diagnosis not present

## 2020-02-13 DIAGNOSIS — R350 Frequency of micturition: Secondary | ICD-10-CM | POA: Diagnosis not present

## 2020-03-05 ENCOUNTER — Ambulatory Visit (INDEPENDENT_AMBULATORY_CARE_PROVIDER_SITE_OTHER): Payer: BC Managed Care – PPO | Admitting: Primary Care

## 2020-03-17 ENCOUNTER — Ambulatory Visit (INDEPENDENT_AMBULATORY_CARE_PROVIDER_SITE_OTHER): Payer: BC Managed Care – PPO | Admitting: Primary Care

## 2020-03-17 ENCOUNTER — Encounter (INDEPENDENT_AMBULATORY_CARE_PROVIDER_SITE_OTHER): Payer: Self-pay | Admitting: Primary Care

## 2020-03-17 ENCOUNTER — Other Ambulatory Visit: Payer: Self-pay

## 2020-03-17 VITALS — BP 113/68 | HR 94 | Temp 98.2°F | Ht 63.0 in | Wt 111.0 lb

## 2020-03-17 DIAGNOSIS — F4323 Adjustment disorder with mixed anxiety and depressed mood: Secondary | ICD-10-CM

## 2020-03-17 DIAGNOSIS — G4700 Insomnia, unspecified: Secondary | ICD-10-CM

## 2020-03-17 DIAGNOSIS — R5383 Other fatigue: Secondary | ICD-10-CM | POA: Diagnosis not present

## 2020-03-17 DIAGNOSIS — Z114 Encounter for screening for human immunodeficiency virus [HIV]: Secondary | ICD-10-CM | POA: Diagnosis not present

## 2020-03-17 DIAGNOSIS — Z1159 Encounter for screening for other viral diseases: Secondary | ICD-10-CM

## 2020-03-17 MED ORDER — FLUOXETINE HCL 10 MG PO CAPS: 10.0000 mg | ORAL_CAPSULE | Freq: Every day | ORAL | 0 refills | Status: DC

## 2020-03-17 NOTE — Patient Instructions (Signed)
Can try melatonin 5mg-15 mg at night for sleep, can also do benadryl 25-50mg at night for sleep.  If this does not help we can try prescription medication.  Also here is some information about good sleep hygiene.   Insomnia Insomnia is frequent trouble falling and/or staying asleep. Insomnia can be a long term problem or a short term problem. Both are common. Insomnia can be a short term problem when the wakefulness is related to a certain stress or worry. Long term insomnia is often related to ongoing stress during waking hours and/or poor sleeping habits. Overtime, sleep deprivation itself can make the problem worse. Every little thing feels more severe because you are overtired and your ability to cope is decreased. CAUSES  Stress, anxiety, and depression. Poor sleeping habits. Distractions such as TV in the bedroom. Naps close to bedtime. Engaging in emotionally charged conversations before bed. Technical reading before sleep. Alcohol and other sedatives. They may make the problem worse. They can hurt normal sleep patterns and normal dream activity. Stimulants such as caffeine for several hours prior to bedtime. Pain syndromes and shortness of breath can cause insomnia. Exercise late at night. Changing time zones may cause sleeping problems (jet lag). It is sometimes helpful to have someone observe your sleeping patterns. They should look for periods of not breathing during the night (sleep apnea). They should also look to see how long those periods last. If you live alone or observers are uncertain, you can also be observed at a sleep clinic where your sleep patterns will be professionally monitored. Sleep apnea requires a checkup and treatment. Give your caregivers your medical history. Give your caregivers observations your family has made about your sleep.  SYMPTOMS  Not feeling rested in the morning. Anxiety and restlessness at bedtime. Difficulty falling and staying asleep. TREATMENT   Your caregiver may prescribe treatment for an underlying medical disorders. Your caregiver can give advice or help if you are using alcohol or other drugs for self-medication. Treatment of underlying problems will usually eliminate insomnia problems. Medications can be prescribed for short time use. They are generally not recommended for lengthy use. Over-the-counter sleep medicines are not recommended for lengthy use. They can be habit forming. You can promote easier sleeping by making lifestyle changes such as: Using relaxation techniques that help with breathing and reduce muscle tension. Exercising earlier in the day. Changing your diet and the time of your last meal. No night time snacks. Establish a regular time to go to bed. Counseling can help with stressful problems and worry. Soothing music and white noise may be helpful if there are background noises you cannot remove. Stop tedious detailed work at least one hour before bedtime. HOME CARE INSTRUCTIONS  Keep a diary. Inform your caregiver about your progress. This includes any medication side effects. See your caregiver regularly. Take note of: Times when you are asleep. Times when you are awake during the night. The quality of your sleep. How you feel the next day. This information will help your caregiver care for you. Get out of bed if you are still awake after 15 minutes. Read or do some quiet activity. Keep the lights down. Wait until you feel sleepy and go back to bed. Keep regular sleeping and waking hours. Avoid naps. Exercise regularly. Avoid distractions at bedtime. Distractions include watching television or engaging in any intense or detailed activity like attempting to balance the household checkbook. Develop a bedtime ritual. Keep a familiar routine of bathing, brushing your teeth,   climbing into bed at the same time each night, listening to soothing music. Routines increase the success of falling to sleep faster. Use  relaxation techniques. This can be using breathing and muscle tension release routines. It can also include visualizing peaceful scenes. You can also help control troubling or intruding thoughts by keeping your mind occupied with boring or repetitive thoughts like the old concept of counting sheep. You can make it more creative like imagining planting one beautiful flower after another in your backyard garden. During your day, work to eliminate stress. When this is not possible use some of the previous suggestions to help reduce the anxiety that accompanies stressful situations. MAKE SURE YOU:  Understand these instructions. Will watch your condition. Will get help right away if you are not doing well or get worse. Document Released: 06/18/2000 Document Revised: 09/13/2011 Document Reviewed: 07/19/2007 ExitCare Patient Information 2015 [employer]. This information is not intended to replace advice given to you by your health care provider. Make sure you discuss any questions you have with your health care provider.  

## 2020-03-17 NOTE — Progress Notes (Signed)
Established Patient Office Visit  Subjective:  Patient ID: Margaret Duncan, female    DOB: 02-Jan-1976  Age: 44 y.o. MRN: [id number]  CC:  Chief Complaint  Patient presents with  . medication effectiveness    HPI Margaret Duncan is a 44 year old female in today for evaluation of trazodone with depression and insomnia. Medication she states helps some but still does not sleep through the night. 2. Make her feel like her body is swaying, and feel unsteady this feeling is daily.  History reviewed. No pertinent past medical history.  History reviewed. No pertinent surgical history.  Family History  Problem Relation Age of Onset  . Breast cancer Paternal Aunt   . Breast cancer Paternal Grandmother   . Breast cancer Paternal Aunt     Social History   Socioeconomic History  . Marital status: Married    Spouse name: Not on file  . Number of children: Not on file  . Years of education: Not on file  . Highest education level: Not on file  Occupational History  . Not on file  Tobacco Use  . Smoking status: Never Smoker  . Smokeless tobacco: Never Used  Substance and Sexual Activity  . Alcohol use: Never  . Drug use: Never  . Sexual activity: Not on file  Other Topics Concern  . Not on file  Social History Narrative  . Not on file   Social Determinants of Health   Financial Resource Strain:   . Difficulty of Paying Living Expenses: Not on file  Food Insecurity:   . Worried About Programme researcher, broadcasting/film/video in the Last Year: Not on file  . Ran Out of Food in the Last Year: Not on file  Transportation Needs:   . Lack of Transportation (Medical): Not on file  . Lack of Transportation (Non-Medical): Not on file  Physical Activity:   . Days of Exercise per Week: Not on file  . Minutes of Exercise per Session: Not on file  Stress:   . Feeling of Stress : Not on file  Social Connections:   . Frequency of Communication with Friends and Family: Not on file  . Frequency of  Social Gatherings with Friends and Family: Not on file  . Attends Religious Services: Not on file  . Active Member of Clubs or Organizations: Not on file  . Attends Banker Meetings: Not on file  . Marital Status: Not on file  Intimate Partner Violence:   . Fear of Current or Ex-Partner: Not on file  . Emotionally Abused: Not on file  . Physically Abused: Not on file  . Sexually Abused: Not on file    Outpatient Medications Prior to Visit  Medication Sig Dispense Refill  . traZODone (DESYREL) 50 MG tablet Take 1 tablet (50 mg total) by mouth at bedtime. 90 tablet 0  . Nutritional Supplements (ENSURE ACTIVE HIGH PROTEIN) LIQD Take 8 oz by mouth 2 (two) times daily. (Patient not taking: Reported on 03/17/2020) 237 mL 11   No facility-administered medications prior to visit.    No Known Allergies  ROS Review of Systems  Psychiatric/Behavioral: Positive for sleep disturbance. The patient is nervous/anxious.        Depression   All other systems reviewed and are negative.     Objective:    Physical Exam Vitals reviewed.  HENT:     Head: Normocephalic.     Nose: Nose normal.  Eyes:     Extraocular Movements: Extraocular movements  intact.  Cardiovascular:     Rate and Rhythm: Normal rate and regular rhythm.     Pulses: Normal pulses.     Heart sounds: Normal heart sounds.  Pulmonary:     Effort: Pulmonary effort is normal.     Breath sounds: Normal breath sounds.  Abdominal:     General: Bowel sounds are normal.  Musculoskeletal:        General: Normal range of motion.     Cervical back: Normal range of motion.  Skin:    General: Skin is warm and dry.  Neurological:     Mental Status: She is alert and oriented to person, place, and time.  Psychiatric:        Mood and Affect: Mood normal.        Thought Content: Thought content normal.     BP 113/68 (BP Location: Left Arm, Patient Position: Sitting, Cuff Size: Small)   Pulse 94   Temp 98.2 F (36.8  C) (Oral)   Ht 5\' 3"  (1.6 m)   Wt 111 lb (50.3 kg)   LMP 03/16/2020   SpO2 100%   BMI 19.66 kg/m  Wt Readings from Last 3 Encounters:  03/17/20 111 lb (50.3 kg)  10/31/19 112 lb 9.6 oz (51.1 kg)  10/02/19 121 lb 4.1 oz (55 kg)     Health Maintenance Due  Topic Date Due  . Hepatitis C Screening  Never done  . HIV Screening  Never done    There are no preventive care reminders to display for this patient.  No results found for: TSH Lab Results  Component Value Date   WBC 11.4 (H) 10/02/2019   HGB 12.5 10/02/2019   HCT 38.7 10/02/2019   MCV 93.9 10/02/2019   PLT 251 10/02/2019   Lab Results  Component Value Date   NA 138 10/02/2019   K 3.7 10/02/2019   CO2 23 10/02/2019   GLUCOSE 110 (H) 10/02/2019   BUN 7 10/02/2019   CREATININE 0.80 10/02/2019   CALCIUM 9.8 10/02/2019   ANIONGAP 12 10/02/2019   No results found for: CHOL No results found for: HDL No results found for: LDLCALC No results found for: TRIG No results found for: CHOLHDL No results found for: 10/04/2019    Assessment & Plan:  H[place]ah was seen today for medication effectiveness.  Diagnoses and all orders for this visit:  Adjustment disorder with mixed anxiety and depressed mood Discontinued trazodone.  To start Prozac tomorrow.  Do not wait for follow-up appointment if any side effects. -     FLUoxetine (PROZAC) 10 MG capsule; Take 1 capsule (10 mg total) by mouth daily.  Insomnia, unspecified type She does not like taking medication therefore on AVS over-the-counter and natural medications suggested melatonin or Benadryl.  Fatigue, unspecified type Unable to gain weight not really have any appetite to eat did not like supplements rule out thyroid dysfunction Body mass index is 19.66 kg/m.  Encounter for screening for HIV -     HIV Antibody (routine testing w rflx)  Encounter for hepatitis C screening test for low risk patient -     Hepatitis C Antibody      Follow-up: Return in about  2 months (around 05/17/2020) for medication effectiveness in person.    05/19/2020, NP

## 2020-03-18 LAB — HIV ANTIBODY (ROUTINE TESTING W REFLEX): HIV Screen 4th Generation wRfx: NONREACTIVE

## 2020-03-18 LAB — HEPATITIS C ANTIBODY: Hep C Virus Ab: <0.1 s/co ratio (ref 0.0–0.9)

## 2020-03-19 ENCOUNTER — Telehealth (INDEPENDENT_AMBULATORY_CARE_PROVIDER_SITE_OTHER): Payer: Self-pay

## 2020-03-19 NOTE — Telephone Encounter (Signed)
-----   Message from Grayce Sessions, NP sent at 03/18/2020 11:10 PM EDT ----- HIV and hepatitis screening were both negative

## 2020-03-19 NOTE — Telephone Encounter (Signed)
Called patient but no answer. Left voicemail notifying her that HIV and Hepatitis C were both negative. Return call to [facility] at [phone] with any questions or concerns. Maryjean Morn, CMA

## 2020-05-19 ENCOUNTER — Ambulatory Visit (INDEPENDENT_AMBULATORY_CARE_PROVIDER_SITE_OTHER): Payer: BC Managed Care – PPO | Admitting: Primary Care

## 2020-05-27 ENCOUNTER — Other Ambulatory Visit: Payer: Self-pay

## 2020-05-27 ENCOUNTER — Ambulatory Visit (INDEPENDENT_AMBULATORY_CARE_PROVIDER_SITE_OTHER): Payer: BC Managed Care – PPO | Admitting: Primary Care

## 2020-05-27 ENCOUNTER — Encounter (INDEPENDENT_AMBULATORY_CARE_PROVIDER_SITE_OTHER): Payer: Self-pay | Admitting: Primary Care

## 2020-05-27 VITALS — BP 101/61 | HR 105 | Temp 97.5°F | Ht 63.0 in | Wt 116.2 lb

## 2020-05-27 DIAGNOSIS — G4700 Insomnia, unspecified: Secondary | ICD-10-CM

## 2020-05-27 DIAGNOSIS — F4323 Adjustment disorder with mixed anxiety and depressed mood: Secondary | ICD-10-CM | POA: Diagnosis not present

## 2020-05-27 MED ORDER — MELATONIN 10 MG/ML PO LIQD
10.0000 mL | Freq: Every evening | ORAL | 1 refills | Status: DC | PRN
Start: 2020-05-27 — End: 2020-10-23

## 2020-05-27 NOTE — Progress Notes (Signed)
Established Patient Office Visit  Subjective:  Patient ID: Margaret Duncan, female    DOB: 09-25-1975  Age: 44 y.o. MRN: [id number]  CC:  Chief Complaint  Patient presents with   medication effectiveness    HPI [name] is a 44 year old female who  presents for follow-up on antidepressant medication prescribed on her last visit for depression and anxiety.  Patient did pick up medication read the side effects and decided to try to manage these problems the naturally.  Today No past medical history on file.  No past surgical history on file.  Family History  Problem Relation Age of Onset   Breast cancer Paternal Aunt    Breast cancer Paternal Grandmother    Breast cancer Paternal Aunt     Social History   Socioeconomic History   Marital status: Married    Spouse name: Not on file   Number of children: Not on file   Years of education: Not on file   Highest education level: Not on file  Occupational History   Not on file  Tobacco Use   Smoking status: Never Smoker   Smokeless tobacco: Never Used  Substance and Sexual Activity   Alcohol use: Never   Drug use: Never   Sexual activity: Not on file  Other Topics Concern   Not on file  Social History Narrative   Not on file   Social Determinants of Health   Financial Resource Strain:    Difficulty of Paying Living Expenses: Not on file  Food Insecurity:    Worried About Running Out of Food in the Last Year: Not on file   Ran Out of Food in the Last Year: Not on file  Transportation Needs:    Lack of Transportation (Medical): Not on file   Lack of Transportation (Non-Medical): Not on file  Physical Activity:    Days of Exercise per Week: Not on file   Minutes of Exercise per Session: Not on file  Stress:    Feeling of Stress : Not on file  Social Connections:    Frequency of Communication with Friends and Family: Not on file   Frequency of Social Gatherings with Friends  and Family: Not on file   Attends Religious Services: Not on file   Active Member of Clubs or Organizations: Not on file   Attends Banker Meetings: Not on file   Marital Status: Not on file  Intimate Partner Violence:    Fear of Current or Ex-Partner: Not on file   Emotionally Abused: Not on file   Physically Abused: Not on file   Sexually Abused: Not on file    Outpatient Medications Prior to Visit  Medication Sig Dispense Refill   FLUoxetine (PROZAC) 10 MG capsule Take 1 capsule (10 mg total) by mouth daily. (Patient not taking: Reported on 05/27/2020) 90 capsule 0   Nutritional Supplements (ENSURE ACTIVE HIGH PROTEIN) LIQD Take 8 oz by mouth 2 (two) times daily. (Patient not taking: Reported on 03/17/2020) 237 mL 11   No facility-administered medications prior to visit.    No Known Allergies  ROS Review of Systems  Psychiatric/Behavioral: Positive for sleep disturbance. The patient is nervous/anxious.        Depression  All other systems reviewed and are negative.     Objective:    Physical Exam Vitals reviewed.  Constitutional:      Appearance: Normal appearance.  HENT:     Head: Normocephalic.     Nose: Nose normal.  Cardiovascular:     Rate and Rhythm: Normal rate and regular rhythm.  Pulmonary:     Effort: Pulmonary effort is normal.     Breath sounds: Normal breath sounds.  Abdominal:     General: Abdomen is flat. Bowel sounds are normal.     Palpations: Abdomen is soft.  Musculoskeletal:     Cervical back: Normal range of motion.  Skin:    General: Skin is warm and dry.  Neurological:     Mental Status: She is alert and oriented to person, place, and time.  Psychiatric:        Mood and Affect: Mood normal.        Behavior: Behavior normal.        Thought Content: Thought content normal.        Judgment: Judgment normal.     BP 101/61 (BP Location: Right Arm, Patient Position: Sitting, Cuff Size: Small)    Pulse (!) 105     Temp (!) 97.5 F (36.4 C) (Temporal)    Ht 5\' 3"  (1.6 m)    Wt 116 lb 3.2 oz (52.7 kg)    LMP 05/26/2020 (Exact Date)    SpO2 99%    BMI 20.58 kg/m  Wt Readings from Last 3 Encounters:  05/27/20 116 lb 3.2 oz (52.7 kg)  03/17/20 111 lb (50.3 kg)  10/31/19 112 lb 9.6 oz (51.1 kg)     There are no preventive care reminders to display for this patient.  There are no preventive care reminders to display for this patient.  No results found for: TSH Lab Results  Component Value Date   WBC 11.4 (H) 10/02/2019   HGB 12.5 10/02/2019   HCT 38.7 10/02/2019   MCV 93.9 10/02/2019   PLT 251 10/02/2019   Lab Results  Component Value Date   NA 138 10/02/2019   K 3.7 10/02/2019   CO2 23 10/02/2019   GLUCOSE 110 (H) 10/02/2019   BUN 7 10/02/2019   CREATININE 0.80 10/02/2019   CALCIUM 9.8 10/02/2019   ANIONGAP 12 10/02/2019   No results found for: CHOL No results found for: HDL No results found for: LDLCALC No results found for: TRIG No results found for: CHOLHDL No results found for: 10/04/2019    Assessment & Plan:  Leiann was seen today for medication effectiveness.  Diagnoses and all orders for this visit:  Adjustment disorder with mixed anxiety and depressed mood Starting taking medication for anxiety and depression  Prozac 10mg    Avoid driving or hazardous activity until you know how this medication will affect you. Your reactions could be impaired. Dizziness or fainting can cause falls, accidents, or severe injuries.  Common side effects include drowsiness, nausea, constipation, loss of appetite, dry mouth, increased sweating.  Do not consume alcohol or illicit substances while taking Prozac 10mg  at bedtime   Call if symptoms occur  pounding heartbeats or fluttering in your chest, a light-headed feeling like you may pass out, easy bruising/unusal bleeding, vision change, difficult or painful urination, impotence/sexual problems, liver problems (right-sided upper stomach  pain, itching, dark urine, yellowing of skin or eyes/jaundice, low levels of sodium in the body (headache, confusion, slurred speech, severe weakness, vomiting, loss of coordination, feeling unsteady), or manic episodes (racing thoughts, increased energy, decreased need for sleep, risk-taking behavior, being agitated, talkative)  Seek medical attention immediately if you have symptoms of serotonin syndrome such as agitation, hallucinations, fever, sweating, shivering, fast heart rate, muscle stiffness, twitching, loss of coordination, nausea, vomiting,  or diarrhea  Report any new or worsening symptoms to your provider, such as: mood or behavior changes, anxiety, panic attacks, trouble sleeping, or if you feel impulsive, irritable, agitated, hostile, aggressive, restless, hyperactive (mentally or physically), more depressed, or have thoughts about suicide or hurting yourself  Insomnia, unspecified type -     Melatonin 10 MG/ML LIQD; Take 10 mLs by mouth at bedtime as needed.    Meds ordered this encounter  Medications   Melatonin 10 MG/ML LIQD    Sig: Take 10 mLs by mouth at bedtime as needed.    Dispense:  59 mL    Refill:  1    Follow-up: Return for to be determine by patient if she takes medication 6 weeks.    Grayce Sessions, NP

## 2020-05-27 NOTE — Patient Instructions (Addendum)
Depression can affect your thoughts and feelings, relationships, daily activities, and physical health. It is caused by changes in the way your brain functions. If you receive a diagnosis of depression, your health care provider will tell you which type of depression you have and what treatment options are available to you. Can try melatonin 5mg -15 mg at night for sleep, can also do benadryl 25-50mg  at night for sleep.  If this does not help we can try prescription medication.  Also here is some information about good sleep hygiene.   Insomnia Insomnia is frequent trouble falling and/or staying asleep. Insomnia can be a long term problem or a short term problem. Both are common. Insomnia can be a short term problem when the wakefulness is related to a certain stress or worry. Long term insomnia is often related to ongoing stress during waking hours and/or poor sleeping habits. Overtime, sleep deprivation itself can make the problem worse. Every little thing feels more severe because you are overtired and your ability to cope is decreased. CAUSES   Stress, anxiety, and depression.  Poor sleeping habits.  Distractions such as TV in the bedroom.  Naps close to bedtime.  Engaging in emotionally charged conversations before bed.  Technical reading before sleep.  Alcohol and other sedatives. They may make the problem worse. They can hurt normal sleep patterns and normal dream activity.  Stimulants such as caffeine for several hours prior to bedtime.  Pain syndromes and shortness of breath can cause insomnia.  Exercise late at night.  Changing time zones may cause sleeping problems (jet lag). It is sometimes helpful to have someone observe your sleeping patterns. They should look for periods of not breathing during the night (sleep apnea). They should also look to see how long those periods last. If you live alone or observers are uncertain, you can also be observed at a sleep clinic where  your sleep patterns will be professionally monitored. Sleep apnea requires a checkup and treatment. Give your caregivers your medical history. Give your caregivers observations your family has made about your sleep.  SYMPTOMS   Not feeling rested in the morning.  Anxiety and restlessness at bedtime.  Difficulty falling and staying asleep. TREATMENT   Your caregiver may prescribe treatment for an underlying medical disorders. Your caregiver can give advice or help if you are using alcohol or other drugs for self-medication. Treatment of underlying problems will usually eliminate insomnia problems.  Medications can be prescribed for short time use. They are generally not recommended for lengthy use.  Over-the-counter sleep medicines are not recommended for lengthy use. They can be habit forming.  You can promote easier sleeping by making lifestyle changes such as:  Using relaxation techniques that help with breathing and reduce muscle tension.  Exercising earlier in the day.  Changing your diet and the time of your last meal. No night time snacks.  Establish a regular time to go to bed.  Counseling can help with stressful problems and worry.  Soothing music and white noise may be helpful if there are background noises you cannot remove.  Stop tedious detailed work at least one hour before bedtime. HOME CARE INSTRUCTIONS   Keep a diary. Inform your caregiver about your progress. This includes any medication side effects. See your caregiver regularly. Take note of:  Times when you are asleep.  Times when you are awake during the night.  The quality of your sleep.  How you feel the next day. This information will help your  caregiver care for you.  Get out of bed if you are still awake after 15 minutes. Read or do some quiet activity. Keep the lights down. Wait until you feel sleepy and go back to bed.  Keep regular sleeping and waking hours. Avoid naps.  Exercise  regularly.  Avoid distractions at bedtime. Distractions include watching television or engaging in any intense or detailed activity like attempting to balance the household checkbook.  Develop a bedtime ritual. Keep a familiar routine of bathing, brushing your teeth, climbing into bed at the same time each night, listening to soothing music. Routines increase the success of falling to sleep faster.  Use relaxation techniques. This can be using breathing and muscle tension release routines. It can also include visualizing peaceful scenes. You can also help control troubling or intruding thoughts by keeping your mind occupied with boring or repetitive thoughts like the old concept of counting sheep. You can make it more creative like imagining planting one beautiful flower after another in your backyard garden.  During your day, work to eliminate stress. When this is not possible use some of the previous suggestions to help reduce the anxiety that accompanies stressful situations. MAKE SURE YOU:   Understand these instructions.  Will watch your condition.  Will get help right away if you are not doing well or get worse. Document Released: 06/18/2000 Document Revised: 09/13/2011 Document Reviewed: 07/19/2007 [facility] Patient Information 2015 [place], [place]. This information is not intended to replace advice given to you by your health care provider. Make sure you discuss any questions you have with your health care provider.

## 2020-07-08 ENCOUNTER — Emergency Department (HOSPITAL_COMMUNITY): Payer: BC Managed Care – PPO

## 2020-07-08 ENCOUNTER — Other Ambulatory Visit: Payer: Self-pay

## 2020-07-08 ENCOUNTER — Ambulatory Visit (INDEPENDENT_AMBULATORY_CARE_PROVIDER_SITE_OTHER): Payer: BC Managed Care – PPO | Admitting: Primary Care

## 2020-07-08 ENCOUNTER — Emergency Department (HOSPITAL_COMMUNITY)
Admission: EM | Admit: 2020-07-08 | Discharge: 2020-07-08 | Disposition: A | Discharge status: Home / Self Care | Payer: BC Managed Care – PPO | Attending: Emergency Medicine | Admitting: Emergency Medicine

## 2020-07-08 ENCOUNTER — Encounter (INDEPENDENT_AMBULATORY_CARE_PROVIDER_SITE_OTHER): Payer: Self-pay | Admitting: Primary Care

## 2020-07-08 DIAGNOSIS — M25511 Pain in right shoulder: Secondary | ICD-10-CM | POA: Diagnosis not present

## 2020-07-08 DIAGNOSIS — R519 Headache, unspecified: Secondary | ICD-10-CM | POA: Diagnosis not present

## 2020-07-08 DIAGNOSIS — M7918 Myalgia, other site: Secondary | ICD-10-CM

## 2020-07-08 DIAGNOSIS — S161XXA Strain of muscle, fascia and tendon at neck level, initial encounter: Secondary | ICD-10-CM | POA: Diagnosis not present

## 2020-07-08 DIAGNOSIS — M542 Cervicalgia: Secondary | ICD-10-CM | POA: Diagnosis not present

## 2020-07-08 DIAGNOSIS — Z041 Encounter for examination and observation following transport accident: Secondary | ICD-10-CM | POA: Diagnosis not present

## 2020-07-08 DIAGNOSIS — R079 Chest pain, unspecified: Secondary | ICD-10-CM | POA: Diagnosis not present

## 2020-07-08 DIAGNOSIS — Z043 Encounter for examination and observation following other accident: Secondary | ICD-10-CM | POA: Diagnosis not present

## 2020-07-08 DIAGNOSIS — S199XXA Unspecified injury of neck, initial encounter: Secondary | ICD-10-CM | POA: Diagnosis not present

## 2020-07-08 MED ORDER — ACETAMINOPHEN 500 MG PO TABS
1000.0000 mg | ORAL_TABLET | Freq: Once | ORAL | Status: AC
  Administered 2020-07-08: 1000 mg via ORAL
  Filled 2020-07-08: qty 2

## 2020-07-08 MED ORDER — CYCLOBENZAPRINE HCL 10 MG PO TABS: 10.0000 mg | ORAL_TABLET | Freq: Two times a day (BID) | ORAL | 0 refills | Status: DC | PRN

## 2020-07-08 MED ORDER — IBUPROFEN 400 MG PO TABS
400.0000 mg | ORAL_TABLET | Freq: Once | ORAL | Status: AC
  Administered 2020-07-08: 400 mg via ORAL
  Filled 2020-07-08: qty 1
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
## 2020-07-08 NOTE — ED Notes (Signed)
DC instructions reviewed with patient and patient verbalized understanding. Patient vss, nad, and ambulatory to lobby.
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
## 2020-07-11 ENCOUNTER — Telehealth (INDEPENDENT_AMBULATORY_CARE_PROVIDER_SITE_OTHER): Payer: Self-pay

## 2020-07-11 ENCOUNTER — Ambulatory Visit (INDEPENDENT_AMBULATORY_CARE_PROVIDER_SITE_OTHER): Payer: Self-pay | Admitting: Primary Care
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
## 2020-07-11 NOTE — Telephone Encounter (Signed)
Sent to PCP ?
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
## 2020-07-12 DIAGNOSIS — M79604 Pain in right leg: Secondary | ICD-10-CM | POA: Diagnosis not present

## 2020-07-12 DIAGNOSIS — Z041 Encounter for examination and observation following transport accident: Secondary | ICD-10-CM | POA: Diagnosis not present

## 2020-07-12 DIAGNOSIS — M549 Dorsalgia, unspecified: Secondary | ICD-10-CM | POA: Diagnosis not present

## 2020-07-12 DIAGNOSIS — M542 Cervicalgia: Secondary | ICD-10-CM | POA: Diagnosis not present

## 2020-07-15 DIAGNOSIS — M25511 Pain in right shoulder: Secondary | ICD-10-CM | POA: Diagnosis not present

## 2020-07-15 DIAGNOSIS — M79604 Pain in right leg: Secondary | ICD-10-CM | POA: Diagnosis not present

## 2020-07-15 DIAGNOSIS — M25512 Pain in left shoulder: Secondary | ICD-10-CM | POA: Diagnosis not present

## 2020-07-17 ENCOUNTER — Other Ambulatory Visit (INDEPENDENT_AMBULATORY_CARE_PROVIDER_SITE_OTHER): Payer: Self-pay | Admitting: Primary Care

## 2020-07-17 NOTE — Telephone Encounter (Signed)
Patient is aware that PCP is unable to keep out of work without an appointment and has referred her to physical therapy

## 2020-07-29 ENCOUNTER — Other Ambulatory Visit: Payer: Self-pay

## 2020-07-29 ENCOUNTER — Ambulatory Visit: Payer: BC Managed Care – PPO | Attending: Primary Care

## 2020-07-29 ENCOUNTER — Ambulatory Visit: Payer: BC Managed Care – PPO

## 2020-07-29 DIAGNOSIS — M549 Dorsalgia, unspecified: Secondary | ICD-10-CM | POA: Diagnosis not present

## 2020-07-29 DIAGNOSIS — G8911 Acute pain due to trauma: Secondary | ICD-10-CM | POA: Diagnosis not present

## 2020-07-29 DIAGNOSIS — M542 Cervicalgia: Secondary | ICD-10-CM | POA: Diagnosis not present

## 2020-07-29 DIAGNOSIS — M545 Low back pain, unspecified: Secondary | ICD-10-CM
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
## 2020-07-31 DIAGNOSIS — N926 Irregular menstruation, unspecified: Secondary | ICD-10-CM | POA: Diagnosis not present

## 2020-08-04 ENCOUNTER — Ambulatory Visit: Payer: BC Managed Care – PPO | Admitting: Physical Therapy

## 2020-08-04 ENCOUNTER — Other Ambulatory Visit: Payer: Self-pay

## 2020-08-04 DIAGNOSIS — M545 Low back pain, unspecified: Secondary | ICD-10-CM | POA: Diagnosis not present

## 2020-08-04 DIAGNOSIS — M542 Cervicalgia: Secondary | ICD-10-CM | POA: Diagnosis not present

## 2020-08-04 DIAGNOSIS — G8911 Acute pain due to trauma: Secondary | ICD-10-CM | POA: Diagnosis not present

## 2020-08-04 DIAGNOSIS — M549 Dorsalgia, unspecified: Secondary | ICD-10-CM | POA: Diagnosis not present
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
## 2020-08-13 ENCOUNTER — Other Ambulatory Visit: Payer: Self-pay

## 2020-08-13 ENCOUNTER — Ambulatory Visit: Payer: BC Managed Care – PPO | Attending: Primary Care

## 2020-08-13 DIAGNOSIS — G8911 Acute pain due to trauma: Secondary | ICD-10-CM | POA: Insufficient documentation

## 2020-08-13 DIAGNOSIS — M545 Low back pain, unspecified: Secondary | ICD-10-CM | POA: Insufficient documentation

## 2020-08-13 DIAGNOSIS — M549 Dorsalgia, unspecified: Secondary | ICD-10-CM | POA: Diagnosis not present

## 2020-08-13 DIAGNOSIS — M542 Cervicalgia: Secondary | ICD-10-CM | POA: Diagnosis not present
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
## 2020-08-19 ENCOUNTER — Other Ambulatory Visit: Payer: Self-pay

## 2020-08-19 ENCOUNTER — Ambulatory Visit: Payer: BC Managed Care – PPO

## 2020-08-19 DIAGNOSIS — M542 Cervicalgia: Secondary | ICD-10-CM

## 2020-08-19 DIAGNOSIS — M545 Low back pain, unspecified: Secondary | ICD-10-CM | POA: Diagnosis not present

## 2020-08-19 DIAGNOSIS — G8911 Acute pain due to trauma: Secondary | ICD-10-CM | POA: Diagnosis not present

## 2020-08-19 DIAGNOSIS — M549 Dorsalgia, unspecified: Secondary | ICD-10-CM | POA: Diagnosis not present
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
## 2020-08-26 ENCOUNTER — Other Ambulatory Visit: Payer: Self-pay

## 2020-08-26 ENCOUNTER — Ambulatory Visit: Payer: BC Managed Care – PPO

## 2020-08-26 DIAGNOSIS — M545 Low back pain, unspecified: Secondary | ICD-10-CM | POA: Diagnosis not present

## 2020-08-26 DIAGNOSIS — M549 Dorsalgia, unspecified: Secondary | ICD-10-CM

## 2020-08-26 DIAGNOSIS — M542 Cervicalgia: Secondary | ICD-10-CM | POA: Diagnosis not present

## 2020-08-26 DIAGNOSIS — G8911 Acute pain due to trauma: Secondary | ICD-10-CM
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
## 2020-08-27 DIAGNOSIS — Z01419 Encounter for gynecological examination (general) (routine) without abnormal findings: Secondary | ICD-10-CM | POA: Diagnosis not present

## 2020-08-27 DIAGNOSIS — Z6820 Body mass index (BMI) 20.0-20.9, adult: Secondary | ICD-10-CM | POA: Diagnosis not present

## 2020-08-27 DIAGNOSIS — Z1231 Encounter for screening mammogram for malignant neoplasm of breast: Secondary | ICD-10-CM | POA: Diagnosis not present

## 2020-09-01 ENCOUNTER — Other Ambulatory Visit (INDEPENDENT_AMBULATORY_CARE_PROVIDER_SITE_OTHER): Payer: Self-pay | Admitting: Primary Care

## 2020-09-01 ENCOUNTER — Telehealth (INDEPENDENT_AMBULATORY_CARE_PROVIDER_SITE_OTHER): Payer: Self-pay | Admitting: Primary Care

## 2020-09-01 DIAGNOSIS — F4323 Adjustment disorder with mixed anxiety and depressed mood: Secondary | ICD-10-CM

## 2020-09-01 MED ORDER — FLUOXETINE HCL 10 MG PO CAPS: 10.0000 mg | ORAL_CAPSULE | Freq: Every day | ORAL | 0 refills | Status: DC
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
## 2020-09-01 NOTE — Telephone Encounter (Signed)
Medication: FLUoxetine (PROZAC) 10 MG capsule [[id number]]   Has the patient contacted their pharmacy? YES  (Agent: If no, request that the patient contact the pharmacy for the refill.) (Agent: If yes, when and what did the pharmacy advise?)  Preferred Pharmacy (with phone number or street name): [id number] - [place] ([place]), [place] - [address]  [address] [place] ([place]) [place] [address]  Phone:  [phone] Fax:  [phone]   Agent: Please be advised that RX refills may take up to 3 business days. We ask that you follow-up with your pharmacy.

## 2020-09-01 NOTE — Telephone Encounter (Signed)
Sent to PCP as FYI. 

## 2020-09-01 NOTE — Telephone Encounter (Signed)
Patient is calling to report that she is having good results with FLUoxetine (PROZAC) 10 MG capsule [[id number] Pt is going to request a refill today. And pt will also drop off FMLA forms today.

## 2020-09-02 ENCOUNTER — Ambulatory Visit: Payer: BC Managed Care – PPO | Attending: Primary Care

## 2020-09-02 ENCOUNTER — Other Ambulatory Visit: Payer: Self-pay

## 2020-09-02 DIAGNOSIS — M542 Cervicalgia: Secondary | ICD-10-CM | POA: Insufficient documentation

## 2020-09-02 DIAGNOSIS — M545 Low back pain, unspecified: Secondary | ICD-10-CM | POA: Diagnosis not present

## 2020-09-02 DIAGNOSIS — M549 Dorsalgia, unspecified: Secondary | ICD-10-CM | POA: Insufficient documentation

## 2020-09-02 DIAGNOSIS — G8911 Acute pain due to trauma: Secondary | ICD-10-CM | POA: Insufficient documentation

## 2020-09-03 ENCOUNTER — Telehealth: Payer: Self-pay | Admitting: Primary Care
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
## 2020-09-05 NOTE — Telephone Encounter (Signed)
Called patient and LVM to return call and schedule an appointment with PCP.

## 2020-10-23 ENCOUNTER — Ambulatory Visit: Payer: BC Managed Care – PPO | Admitting: Critical Care Medicine

## 2020-10-23 ENCOUNTER — Other Ambulatory Visit: Payer: Self-pay

## 2020-10-23 ENCOUNTER — Ambulatory Visit (INDEPENDENT_AMBULATORY_CARE_PROVIDER_SITE_OTHER): Payer: Self-pay | Admitting: *Deleted

## 2020-10-23 VITALS — BP 102/49 | HR 75 | Ht 63.0 in | Wt 113.0 lb

## 2020-10-23 DIAGNOSIS — K580 Irritable bowel syndrome with diarrhea: Secondary | ICD-10-CM | POA: Diagnosis not present

## 2020-10-23 DIAGNOSIS — R300 Dysuria: Secondary | ICD-10-CM | POA: Insufficient documentation

## 2020-10-23 DIAGNOSIS — B3731 Acute candidiasis of vulva and vagina: Secondary | ICD-10-CM | POA: Insufficient documentation

## 2020-10-23 DIAGNOSIS — B373 Candidiasis of vulva and vagina: Secondary | ICD-10-CM | POA: Insufficient documentation

## 2020-10-23 MED ORDER — ONDANSETRON HCL 4 MG PO TABS: 4.0000 mg | ORAL_TABLET | Freq: Three times a day (TID) | ORAL | 0 refills | Status: DC | PRN

## 2020-10-23 MED ORDER — DICYCLOMINE HCL 10 MG PO CAPS
10.0000 mg | ORAL_CAPSULE | Freq: Three times a day (TID) | ORAL | 1 refills | Status: DC
Start: 2020-10-23 — End: 2022-11-25

## 2020-10-23 MED ORDER — LOPERAMIDE HCL 2 MG PO TABS: 2.0000 mg | ORAL_TABLET | Freq: Four times a day (QID) | ORAL | 0 refills | Status: DC | PRN

## 2020-10-23 MED ORDER — PANTOPRAZOLE SODIUM 40 MG PO TBEC: 40.0000 mg | DELAYED_RELEASE_TABLET | Freq: Every day | ORAL | 3 refills | Status: DC

## 2020-10-23 NOTE — Patient Instructions (Signed)
Begin dicyclomine 10 mg 3 times daily at meals Begin pantoprazole 1 daily half hour before breakfast then eat Use Imodium over-the-counter as needed for diarrhea loose stools Use Zofran as needed for nausea Use Tylenol as needed for headaches 500 mg every 6 hours Referral to gastroenterology will be made Labs today include metabolic panel blood counts and lipase to check your pancreas Please obtain a nutritional supplement Ensure or boost and take 1 or 2 daily your flavor preference Keep follow-up appointments with your primary care provider as scheduled   Irritable Bowel Syndrome, Adult  Irritable bowel syndrome (IBS) is a group of symptoms that affects the organs responsible for digestion (gastrointestinal or GI tract). IBS is not one specific disease. To regulate how the GI tract works, the body sends signals back and forth between the intestines and the brain. If you have IBS, there may be a problem with these signals. As a result, the GI tract does not function normally. The intestines may become more sensitive and overreact to certain things. This may be especially true when you eat certain foods or when you are under stress. There are four types of IBS. These may be determined based on the consistency of your stool (feces):  IBS with diarrhea.  IBS with constipation.  Mixed IBS.  Unsubtyped IBS. It is important to know which type of IBS you have. Certain treatments are more likely to be helpful for certain types of IBS. What are the causes? The exact cause of IBS is not known. What increases the risk? You may have a higher risk for IBS if you:  Are female.  Are younger than 61.  Have a family history of IBS.  Have a mental health condition, such as depression, anxiety, or post-traumatic stress disorder.  Have had a bacterial infection of your GI tract. What are the signs or symptoms? Symptoms of IBS vary from person to person. The main symptom is abdominal pain or  discomfort. Other symptoms usually include one or more of the following:  Diarrhea, constipation, or both.  Abdominal swelling or bloating.  Feeling full after eating a small or regular-sized meal.  Frequent gas.  Mucus in the stool.  A feeling of having more stool left after a bowel movement. Symptoms tend to come and go. They may be triggered by stress, mental health conditions, or certain foods. How is this diagnosed? This condition may be diagnosed based on a physical exam, your medical history, and your symptoms. You may have tests, such as:  Blood tests.  Stool test.  X-rays.  CT scan.  Colonoscopy. This is a procedure in which your GI tract is viewed with a long, thin, flexible tube. How is this treated? There is no cure for IBS, but treatment can help relieve symptoms. Treatment depends on the type of IBS you have, and may include:  Changes to your diet, such as: ? Avoiding foods that cause symptoms. ? Drinking more water. ? Following a low-FODMAP (fermentable oligosaccharides, disaccharides, monosaccharides, and polyols) diet for up to 6 weeks, or as told by your health care provider. FODMAPs are sugars that are hard for some people to digest. ? Eating more fiber. ? Eating medium-sized meals at the same times every day.  Medicines. These may include: ? Fiber supplements, if you have constipation. ? Medicine to control diarrhea (antidiarrheal medicines). ? Medicine to help control muscle tightening (spasms) in your GI tract (antispasmodic medicines). ? Medicines to help with mental health conditions, such as antidepressants or  tranquilizers.  Talk therapy or counseling.  Working with a diet and nutrition specialist (dietitian) to help create a food plan that is right for you.  Managing your stress. Follow these instructions at home: Eating and drinking  Eat a healthy diet.  Eat medium-sized meals at about the same time every day. Do not eat large  meals.  Gradually eat more fiber-rich foods. These include whole grains, fruits, and vegetables. This may be especially helpful if you have IBS with constipation.  Eat a diet low in FODMAPs.  Drink enough fluid to keep your urine pale yellow.  Keep a journal of foods that seem to trigger symptoms.  Avoid foods and drinks that: ? Contain added sugar. ? Make your symptoms worse. Dairy products, caffeinated drinks, and carbonated drinks can make symptoms worse for some people. General instructions  Take over-the-counter and prescription medicines and supplements only as told by your health care provider.  Get enough exercise. Do at least 150 minutes of moderate-intensity exercise each week.  Manage your stress. Getting enough sleep and exercise can help you manage stress.  Keep all follow-up visits as told by your health care provider and therapist. This is important. Alcohol Use  Do not drink alcohol if: ? Your health care provider tells you not to drink. ? You are pregnant, may be pregnant, or are planning to become pregnant.  If you drink alcohol, limit how much you have: ? 0-1 drink a day for women. ? 0-2 drinks a day for men.  Be aware of how much alcohol is in your drink. In the [place] one drink equals one typical bottle of beer (12 oz), one-half glass of wine (5 oz), or one shot of hard liquor (1 oz). Contact a health care provider if you have:  Constant pain.  Weight loss.  Difficulty or pain when swallowing.  Diarrhea that gets worse. Get help right away if you have:  Severe abdominal pain.  Fever.  Diarrhea with symptoms of dehydration, such as dizziness or dry mouth.  Bright red blood in your stool.  Stool that is black and tarry.  Abdominal swelling.  Vomiting that does not stop.  Blood in your vomit. Summary  Irritable bowel syndrome (IBS) is not one specific disease. It is a group of symptoms that affects digestion.  Your intestines may become  more sensitive and overreact to certain things. This may be especially true when you eat certain foods or when you are under stress.  There is no cure for IBS, but treatment can help relieve symptoms. This information is not intended to replace advice given to you by your health care provider. Make sure you discuss any questions you have with your health care provider. Document Revised: 02/21/2020 Document Reviewed: 02/21/2020 Elsevier Patient Education  2021 Elsevier Inc.  Diet for Irritable Bowel Syndrome When you have irritable bowel syndrome (IBS), it is very important to eat the foods and follow the eating habits that are best for your condition. IBS may cause various symptoms such as pain in the abdomen, constipation, or diarrhea. Choosing the right foods can help to ease the discomfort from these symptoms. Work with your health care provider and diet and nutrition specialist (dietitian) to find the eating plan that will help to control your symptoms. What are tips for following this plan?  Keep a food diary. This will help you identify foods that cause symptoms. Write down: ? What you eat and when you eat it. ? What symptoms you have. ?  When symptoms occur in relation to your meals, such as "pain in abdomen 2 hours after dinner."  Eat your meals slowly and in a relaxed setting.  Aim to eat 5-6 small meals per day. Do not skip meals.  Drink enough fluid to keep your urine pale yellow.  Ask your health care provider if you should take an over-the-counter probiotic to help restore healthy bacteria in your gut (digestive tract). ? Probiotics are foods that contain good bacteria and yeasts.  Your dietitian may have specific dietary recommendations for you based on your symptoms. He or she may recommend that you: ? Avoid foods that cause symptoms. Talk with your dietitian about other ways to get the same nutrients that are in those problem foods. ? Avoid foods with gluten. Gluten is a  protein that is found in rye, wheat, and barley. ? Eat more foods that contain soluble fiber. Examples of foods with high soluble fiber include oats, seeds, and certain fruits and vegetables. Take a fiber supplement if directed by your dietitian. ? Reduce or avoid certain foods called FODMAPs. These are foods that contain carbohydrates that are hard to digest. Ask your doctor which foods contain these carbohydrates.      What foods are not recommended? The following are some foods and drinks that may make your symptoms worse:  Fatty foods, such as french fries.  Foods that contain gluten, such as pasta and cereal.  Dairy products, such as milk, cheese, and ice cream.  Chocolate.  Alcohol.  Products with caffeine, such as coffee.  Carbonated drinks, such as soda.  Foods that are high in FODMAPs. These include certain fruits and vegetables.  Products with sweeteners such as honey, high fructose corn syrup, sorbitol, and mannitol. The items listed above may not be a complete list of foods and beverages you should avoid. Contact a dietitian for more information.   What foods are good sources of fiber? Your health care provider or dietitian may recommend that you eat more foods that contain fiber. Fiber can help to reduce constipation and other IBS symptoms. Add foods with fiber to your diet a little at a time so your body can get used to them. Too much fiber at one time might cause gas and swelling of your abdomen. The following are some foods that are good sources of fiber:  Berries, such as raspberries, strawberries, and blueberries.  Tomatoes.  Carrots.  Brown rice.  Oats.  Seeds, such as chia and pumpkin seeds. The items listed above may not be a complete list of recommended sources of fiber. Contact your dietitian for more options. Where to find more information  International Foundation for Functional Gastrointestinal Disorders: www.iffgd.[employer] of  Diabetes and Digestive and Kidney Diseases: [employer].tn Summary  When you have irritable bowel syndrome (IBS), it is very important to eat the foods and follow the eating habits that are best for your condition.  IBS may cause various symptoms such as pain in the abdomen, constipation, or diarrhea.  Choosing the right foods can help to ease the discomfort that comes from symptoms.  Keep a food diary. This will help you identify foods that cause symptoms.  Your health care provider or diet and nutrition specialist (dietitian) may recommend that you eat more foods that contain fiber. This information is not intended to replace advice given to you by your health care provider. Make sure you discuss any questions you have with your health care provider. Document Revised: 02/21/2020 Document Reviewed:  02/21/2020 Elsevier Patient Education  2021 [employer]

## 2020-10-23 NOTE — Assessment & Plan Note (Signed)
Symptom complex most consistent with irritable bowel syndrome  Plan to begin Bentyl 10 mg 3 times daily with meals along with Zofran as needed we will begin pantoprazole 40 mg before breakfast  Will also give Imodium as needed for diarrhea loose stools and obtain a metabolic panel lipase and blood counts as she is not had labs in over a year  Plan will be to refer to gastroenterology for the irritable bowel syndrome as she does have Medicaid  She will also follow-up with her primary care provider

## 2020-10-23 NOTE — Progress Notes (Signed)
2 weeks with abdominal pain

## 2020-10-23 NOTE — Progress Notes (Signed)
Subjective:    Patient ID: Margaret Duncan, female    DOB: 08-Jan-1976, 45 y.o.   MRN: [id number]  45 y.o.F here for abdominal cramps and pain  10/23/2020 This is a primary care patient of Margaret Duncan here on the mobile medicine unit today complaining of abdominal pain  Patient states she has had a history of recurrent abdominal pain going on now for 2 weeks.  She has had prior episodes of this occurring about every 2 months for several years.  She has had weight loss with this complains of nausea without emesis diarrhea 3 times daily without blood in the stool.  She complains of left lower quadrant pain shooting through to the back with the abdominal cramping increasing.  She does not drink alcohol or smoke cigarettes.  She had seeing a gastroenterologist in the [place] area previously several years ago but none recently.  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  On arrival blood pressure is low at 102/49.  Patient's not been eating on a consistent basis.  Patient does have some degree of anxiety and depression however this is all well controlled currently on Prozac.  Patient's had recurrent history of candidiasis in the past and just finished a course of Diflucan from her gynecologist.  Patient has normal periods and has had bilateral tubal ligation.  She has 2 children who were delivered via C-section    History reviewed. No pertinent past medical history.   Family History  Problem Relation Age of Onset  . Breast cancer Paternal Aunt   . Breast cancer Paternal Grandmother   . Breast cancer Paternal Aunt      Social History   Socioeconomic History  . Marital status: Married    Spouse name: Not on file  . Number of children: Not on file  . Years of education: Not on file  . Highest education level: Not on file  Occupational History  . Not on file  Tobacco Use  . Smoking status: Never Smoker  . Smokeless tobacco: Never Used   Substance and Sexual Activity  . Alcohol use: Never  . Drug use: Never  . Sexual activity: Not on file  Other Topics Concern  . Not on file  Social History Narrative  . Not on file   Social Determinants of Health   Financial Resource Strain: Not on file  Food Insecurity: Not on file  Transportation Needs: Not on file  Physical Activity: Not on file  Stress: Not on file  Social Connections: Not on file  Intimate Partner Violence: Not on file     No Known Allergies   Outpatient Medications Prior to Visit  Medication Sig Dispense Refill  . FLUoxetine (PROZAC) 10 MG capsule Take 1 capsule (10 mg total) by mouth daily. 90 capsule 0  . cyclobenzaprine (FLEXERIL) 10 MG tablet Take 1 tablet (10 mg total) by mouth 2 (two) times daily as needed for muscle spasms. (Patient not taking: Reported on 10/23/2020) 20 tablet 0  . Melatonin 10 MG/ML LIQD Take 10 mLs by mouth at bedtime as needed. (Patient not taking: Reported on 10/23/2020) 59 mL 1  . Nutritional Supplements (ENSURE ACTIVE HIGH PROTEIN) LIQD Take 8 oz by mouth 2 (two) times daily. (Patient not taking: No sig reported) 237 mL 11   No facility-administered medications prior to visit.    Review of Systems  Constitutional: Positive for appetite change.  HENT: Positive for congestion, postnasal drip, sinus pressure and sore throat.  Negative for mouth sores and rhinorrhea.   Respiratory: Positive for cough. Negative for shortness of breath, wheezing and stridor.        Cough prod green  Cardiovascular: Negative.   Gastrointestinal: Positive for abdominal distention, abdominal pain, diarrhea and nausea. Negative for anal bleeding, blood in stool, constipation, rectal pain and vomiting.  Genitourinary: Negative for difficulty urinating, dysuria, frequency and vaginal discharge.  Musculoskeletal: Negative.   Skin: Negative.   Neurological: Positive for light-headedness and headaches.  Psychiatric/Behavioral: Negative for dysphoric  mood. The patient is not nervous/anxious.        Hx depression anxiety better on prozac       Objective:   Physical Exam Vitals:   10/23/20 1243  BP: (!) 102/49  Pulse: 75  SpO2: 100%  Weight: 113 lb (51.3 kg)  Height: 5\' 3"  (1.6 m)    Gen: Pleasant, thin, in no distress,  normal affect  ENT: No lesions,  mouth clear,  oropharynx clear, no postnasal drip  Neck: No JVD, no TMG, no carotid bruits  Lungs: No use of accessory muscles, no dullness to percussion, clear without rales or rhonchi  Cardiovascular: RRR, heart sounds normal, no murmur or gallops, no peripheral edema  Abdomen: soft mildly tender left lower quadrant and epigastric area, no HSM,  BS hyperactive no guarding or rebound  Musculoskeletal: No deformities, no cyanosis or clubbing  Neuro: alert, non focal  Skin: Warm, no lesions or rashes, note skin turgor is poor        Assessment & Plan:  I personally reviewed all images and lab data in the [facility] system as well as any outside material available during this office visit and agree with the  radiology impressions.   Irritable bowel syndrome with diarrhea Symptom complex most consistent with irritable bowel syndrome  Plan to begin Bentyl 10 mg 3 times daily with meals along with Zofran as needed we will begin pantoprazole 40 mg before breakfast  Will also give Imodium as needed for diarrhea loose stools and obtain a metabolic panel lipase and blood counts as she is not had labs in over a year  Plan will be to refer to gastroenterology for the irritable bowel syndrome as she does have Medicaid  She will also follow-up with her primary care provider   Margaret Duncan was seen today for abdominal pain.  Diagnoses and all orders for this visit:  Irritable bowel syndrome with diarrhea -     Comprehensive metabolic panel -     Lipase -     Ambulatory referral to Gastroenterology -     CBC with Differential/Platelet; Future  Other orders -     dicyclomine  (BENTYL) 10 MG capsule; Take 1 capsule (10 mg total) by mouth 3 (three) times daily before meals. -     pantoprazole (PROTONIX) 40 MG tablet; Take 1 tablet (40 mg total) by mouth daily. -     ondansetron (ZOFRAN) 4 MG tablet; Take 1 tablet (4 mg total) by mouth every 8 (eight) hours as needed for nausea or vomiting. -     loperamide (IMODIUM A-D) 2 MG tablet; Take 1 tablet (2 mg total) by mouth 4 (four) times daily as needed for diarrhea or loose stools.   I spent 31 minutes on this patient including review of records examination and formulating a moderate medical decision making plan and moderate risk

## 2020-10-23 NOTE — Telephone Encounter (Signed)
Patient is calling to report she has had on/off midline lower abdominal pain/cramping for months now- it is not going away or getting better. Patient relates it to her bowel- she does have constipation- but reports her bowel can be loose at times too.Patient has been scheduled for appointment with provider- patient states she wants to be seen today- advised mobile unit- she will go there today and follow up in office with PCP.  Reason for Disposition . Abdominal pain is a chronic symptom (recurrent or ongoing AND present > 4 weeks)  Answer Assessment - Initial Assessment Questions 1. LOCATION: "Where does it hurt?"      Midline lower and some upper at times 2. RADIATION: "Does the pain shoot anywhere else?" (e.g., chest, back)     Back-L, lower 3. ONSET: "When did the pain begin?" (e.g., minutes, hours or days ago)      2 weeks 4. SUDDEN: "Gradual or sudden onset?"     Gradual- gotten worse last 2 days 5. PATTERN "Does the pain come and go, or is it constant?"    - If constant: "Is it getting better, staying the same, or worsening?"      (Note: Constant means the pain never goes away completely; most serious pain is constant and it progresses)     - If intermittent: "How long does it last?" "Do you have pain now?"     (Note: Intermittent means the pain goes away completely between bouts)     Intermittent- cramps on/off all day- lasting 2-3 minutes 6. SEVERITY: "How bad is the pain?"  (e.g., Scale 1-10; mild, moderate, or severe)   - MILD (1-3): doesn't interfere with normal activities, abdomen soft and not tender to touch    - MODERATE (4-7): interferes with normal activities or awakens from sleep, tender to touch    - SEVERE (8-10): excruciating pain, doubled over, unable to do any normal activities      Moderate/severe 7. RECURRENT SYMPTOM: "Have you ever had this type of stomach pain before?" If Yes, ask: "When was the last time?" and "What happened that time?"      Yes- every few  months- patient was seen and diagnosed with constipation. 8. CAUSE: "What do you think is causing the stomach pain?"     constipation 9. RELIEVING/AGGRAVATING FACTORS: "What makes it better or worse?" (e.g., movement, antacids, bowel movement)     Patient did use laxative- didn't help the pain 10. OTHER SYMPTOMS: "Has there been any vomiting, diarrhea, constipation, or urine problems?"       Nausea- comes with the pain, headache 11. PREGNANCY: "Is there any chance you are pregnant?" "When was your last menstrual period?"       No- LMP-10/07/20  Protocols used: ABDOMINAL PAIN - [facility]

## 2020-10-24 LAB — COMPREHENSIVE METABOLIC PANEL
ALT: 12 IU/L (ref 0–32)
AST: 16 IU/L (ref 0–40)
Albumin/Globulin Ratio: 1.7 (ref 1.2–2.2)
Albumin: 4.8 g/dL (ref 3.8–4.8)
Alkaline Phosphatase: 43 IU/L — ABNORMAL LOW (ref 44–121)
BUN/Creatinine Ratio: 14 (ref 9–23)
BUN: 11 mg/dL (ref 6–24)
Bilirubin Total: <0.2 mg/dL (ref 0.0–1.2)
CO2: 15 mmol/L — ABNORMAL LOW (ref 20–29)
Calcium: 10.1 mg/dL (ref 8.7–10.2)
Chloride: 104 mmol/L (ref 96–106)
Creatinine, Ser: 0.78 mg/dL (ref 0.57–1.00)
Globulin, Total: 2.9 g/dL (ref 1.5–4.5)
Glucose: 102 mg/dL — ABNORMAL HIGH (ref 65–99)
Potassium: 4.2 mmol/L (ref 3.5–5.2)
Sodium: 141 mmol/L (ref 134–144)
Total Protein: 7.7 g/dL (ref 6.0–8.5)
eGFR: 97 mL/min/{1.73_m2} (ref >59)

## 2020-10-24 LAB — LIPASE: Lipase: 28 U/L (ref 14–72)

## 2020-10-24 NOTE — Progress Notes (Signed)
Let pat know all labs normal. Liver kidney pancreas normal

## 2020-10-31 ENCOUNTER — Ambulatory Visit (INDEPENDENT_AMBULATORY_CARE_PROVIDER_SITE_OTHER): Payer: BC Managed Care – PPO | Admitting: Primary Care

## 2020-11-06 ENCOUNTER — Other Ambulatory Visit: Payer: Self-pay

## 2020-11-06 ENCOUNTER — Ambulatory Visit (INDEPENDENT_AMBULATORY_CARE_PROVIDER_SITE_OTHER): Payer: BC Managed Care – PPO | Admitting: Primary Care

## 2020-11-06 ENCOUNTER — Encounter (INDEPENDENT_AMBULATORY_CARE_PROVIDER_SITE_OTHER): Payer: Self-pay | Admitting: Primary Care

## 2020-11-06 VITALS — BP 118/72 | HR 80 | Temp 97.3°F | Ht 63.0 in | Wt 112.4 lb

## 2020-11-06 DIAGNOSIS — K5792 Diverticulitis of intestine, part unspecified, without perforation or abscess without bleeding: Secondary | ICD-10-CM

## 2020-11-06 DIAGNOSIS — K580 Irritable bowel syndrome with diarrhea: Secondary | ICD-10-CM

## 2020-11-06 DIAGNOSIS — F4323 Adjustment disorder with mixed anxiety and depressed mood: Secondary | ICD-10-CM

## 2020-11-06 DIAGNOSIS — Z9189 Other specified personal risk factors, not elsewhere classified: Secondary | ICD-10-CM

## 2020-11-06 DIAGNOSIS — K5900 Constipation, unspecified: Secondary | ICD-10-CM

## 2020-11-06 MED ORDER — FLUOXETINE HCL 10 MG PO CAPS
10.0000 mg | ORAL_CAPSULE | Freq: Every day | ORAL | 0 refills | Status: DC
Start: 2020-11-06 — End: 2021-03-25

## 2020-11-06 NOTE — Progress Notes (Signed)
Sometimes has trouble digesting food

## 2020-11-06 NOTE — Progress Notes (Signed)
[facility]   Subjective:   Margaret Duncan is a 45 y.o. female Patient presents today for abdominal pain. The pain is described as aching and burning, and is 5/10 in intensity. Pain is located in the LLQ, diffusely without radiation. Onset was 3 weeks ago. Symptoms have been gradually improving since. Aggravating factors: activity and NSAIDs.  Alleviating factors: bowel movements, flatus, proton pump inhibitors and sitting up. Associated symptoms: belching, constipation and nausea. The patient denies chills, fever and nausea. Concern with unable to digest food.   Symptoms Nausea/Vomiting: no  Diarrhea: no  Constipation: yes  Melena/BRBPR: no  Hematemesis: no  Anorexia: no  Fever/Chills: no  Dysuria: no  Rash: no  Wt loss: no  EtOH use: no  NSAIDs/ASA: yes  LMP: 11/02/20 Vaginal bleeding: yes  STD risk/hx: no   No past medical history on file. No past surgical history on file. No Known Allergies Current Outpatient Medications on File Prior to Visit  Medication Sig Dispense Refill  . dicyclomine (BENTYL) 10 MG capsule Take 1 capsule (10 mg total) by mouth 3 (three) times daily before meals. 90 capsule 1  . FLUoxetine (PROZAC) 10 MG capsule Take 1 capsule (10 mg total) by mouth daily. 90 capsule 0  . loperamide (IMODIUM A-D) 2 MG tablet Take 1 tablet (2 mg total) by mouth 4 (four) times daily as needed for diarrhea or loose stools. 30 tablet 0  . ondansetron (ZOFRAN) 4 MG tablet Take 1 tablet (4 mg total) by mouth every 8 (eight) hours as needed for nausea or vomiting. 20 tablet 0  . pantoprazole (PROTONIX) 40 MG tablet Take 1 tablet (40 mg total) by mouth daily. 30 tablet 3   No current facility-administered medications on file prior to visit.   Social History   Socioeconomic History  . Marital status: Married    Spouse name: Not on file  . Number of children: Not on file  . Years of education: Not on file  . Highest education level: Not on file   Occupational History  . Not on file  Tobacco Use  . Smoking status: Never Smoker  . Smokeless tobacco: Never Used  Substance and Sexual Activity  . Alcohol use: Never  . Drug use: Never  . Sexual activity: Not on file  Other Topics Concern  . Not on file  Social History Narrative  . Not on file   Social Determinants of Health   Financial Resource Strain: Not on file  Food Insecurity: Not on file  Transportation Needs: Not on file  Physical Activity: Not on file  Stress: Not on file  Social Connections: Not on file  Intimate Partner Violence: Not on file   Family History  Problem Relation Age of Onset  . Breast cancer Paternal Aunt   . Breast cancer Paternal Grandmother   . Breast cancer Paternal Aunt     OBJECTIVE:  Vitals:   11/06/20 1600  BP: 118/72  Pulse: 80  Temp: (!) 97.3 F (36.3 C)  TempSrc: Temporal  SpO2: 97%  Weight: 112 lb 6.4 oz (51 kg)  Height: 5\' 3"  (1.6 m)    Review of Systems  Gastrointestinal: Positive for abdominal pain and constipation.  Musculoskeletal: Positive for back pain.  Neurological: Positive for headaches.  All other systems reviewed and are negative.    ASSESSMENT & PLAN:  Kristen was seen today for abdominal pain.  Diagnoses and all orders for this visit:  Irritable bowel syndrome with diarrhea IBS may cause various symptoms  such as pain in the abdomen, constipation, or diarrhea Medication prescribed prior to visit for  Bentyl 10 mg 3 times daily with meals along with Zofran as needed we will begin pantoprazole 40 mg before breakfast -     Ambulatory referral to Gastroenterology  Constipation, unspecified constipation type Increase water, fibers, fruits and vegtables taking miralax when needed ( resolved ) -     Ambulatory referral to Gastroenterology   At risk for impaired digestion Asked when she has a BM does she see undigested food in her stool- Yes- Denies any gastric surgery. Recently started eating pecans  and been eating corn for the last few days which has increased abdominal pain.   Diverticulitis Problems with digesting foods Differential dx she has pain in the belly on the left side , feeling cold and cramping. Discussed eating small frequent meal, reduction in acidic foods, fried foods ,spicy foods, alcohol caffeine and tobacco and certain medications. Avoid laying down after eating 28mins-1hour, elevated head of the bed.   Adjustment disorder with mixed anxiety and depressed mood Prozac 10mg  refill and she can tell the difference in sleeping better and calm feels more like "normal" This note has been created with . Any transcriptional errors are unintentional.   Education officer, environmental, NP 11/06/2020, 4:22 PM

## 2020-11-06 NOTE — Patient Instructions (Addendum)
[email or web address]">  Generalized Anxiety Disorder, Adult Generalized anxiety disorder (GAD) is a mental health condition. Unlike normal worries, anxiety related to GAD is not triggered by a specific event. These worries do not fade or get better with time. GAD interferes with relationships, work, and school. GAD symptoms can vary from mild to severe. People with severe GAD can have intense waves of anxiety with physical symptoms that are similar to panic attacks. What are the causes? The exact cause of GAD is not known, but the following are believed to have an impact:  Differences in natural brain chemicals.  Genes passed down from parents to children.  Differences in the way threats are perceived.  Development during childhood.  Personality. What increases the risk? The following factors may make you more likely to develop this condition:  Being female.  Having a family history of anxiety disorders.  Being very shy.  Experiencing very stressful life events, such as the death of a loved one.  Having a very stressful family environment. What are the signs or symptoms? People with GAD often worry excessively about many things in their lives, such as their health and family. Symptoms may also include:  Mental and emotional symptoms: ? Worrying excessively about natural disasters. ? Fear of being late. ? Difficulty concentrating. ? Fears that others are judging your performance.  Physical symptoms: ? Fatigue. ? Headaches, muscle tension, muscle twitches, trembling, or feeling shaky. ? Feeling like your heart is pounding or beating very fast. ? Feeling out of breath or like you cannot take a deep breath. ? Having trouble falling asleep or staying asleep, or experiencing restlessness. ? Sweating. ? Nausea, diarrhea, or irritable bowel syndrome (IBS).  Behavioral symptoms: ? Experiencing erratic moods or irritability. ? Avoidance of new situations. ? Avoidance of  people. ? Extreme difficulty making decisions. How is this diagnosed? This condition is diagnosed based on your symptoms and medical history. You will also have a physical exam. Your health care provider may perform tests to rule out other possible causes of your symptoms. To be diagnosed with GAD, a person must have anxiety that:  Is out of his or her control.  Affects several different aspects of his or her life, such as work and relationships.  Causes distress that makes him or her unable to take part in normal activities.  Includes at least three symptoms of GAD, such as restlessness, fatigue, trouble concentrating, irritability, muscle tension, or sleep problems. Before your health care provider can confirm a diagnosis of GAD, these symptoms must be present more days than they are not, and they must last for 6 months or longer. How is this treated? This condition may be treated with:  Medicine. Antidepressant medicine is usually prescribed for long-term daily control. Anti-anxiety medicines may be added in severe cases, especially when panic attacks occur.  Talk therapy (psychotherapy). Certain types of talk therapy can be helpful in treating GAD by providing support, education, and guidance. Options include: ? Cognitive behavioral therapy (CBT). People learn coping skills and self-calming techniques to ease their physical symptoms. They learn to identify unrealistic thoughts and behaviors and to replace them with more appropriate thoughts and behaviors. ? Acceptance and commitment therapy (ACT). This treatment teaches people how to be mindful as a way to cope with unwanted thoughts and feelings. ? Biofeedback. This process trains you to manage your body's response (physiological response) through breathing techniques and relaxation methods. You will work with a therapist while machines are used to monitor your physical   symptoms.  Stress management techniques. These include yoga,  meditation, and exercise. A mental health specialist can help determine which treatment is best for you. Some people see improvement with one type of therapy. However, other people require a combination of therapies.   Follow these instructions at home: Lifestyle  Maintain a consistent routine and schedule.  Anticipate stressful situations. Create a plan, and allow extra time to work with your plan.  Practice stress management or self-calming techniques that you have learned from your therapist or your health care provider. General instructions  Take over-the-counter and prescription medicines only as told by your health care provider.  Understand that you are likely to have setbacks. Accept this and be kind to yourself as you persist to take better care of yourself.  Recognize and accept your accomplishments, even if you judge them as small.  Keep all follow-up visits as told by your health care provider. This is important. Contact a health care provider if:  Your symptoms do not get better.  Your symptoms get worse.  You have signs of depression, such as: ? A persistently sad or irritable mood. ? Loss of enjoyment in activities that used to bring you joy. ? Change in weight or eating. ? Changes in sleeping habits. ? Avoiding friends or family members. ? Loss of energy for normal tasks. ? Feelings of guilt or worthlessness. Get help right away if:  You have serious thoughts about hurting yourself or others. If you ever feel like you may hurt yourself or others, or have thoughts about taking your own life, get help right away. Go to your nearest emergency department or:  Call your local emergency services (911 in the [place]  Call a suicide crisis helpline, such as the National Suicide Prevention Lifeline at 1-800-273-8255. This is open 24 hours a day in the [place]  Text the Crisis Text Line at 741741 (in the [place] Summary  Generalized anxiety disorder (GAD) is a mental  health condition that involves worry that is not triggered by a specific event.  People with GAD often worry excessively about many things in their lives, such as their health and family.  GAD may cause symptoms such as restlessness, trouble concentrating, sleep problems, frequent sweating, nausea, diarrhea, headaches, and trembling or muscle twitching.  A mental health specialist can help determine which treatment is best for you. Some people see improvement with one type of therapy. However, other people require a combination of therapies. This information is not intended to replace advice given to you by your health care provider. Make sure you discuss any questions you have with your health care provider. Document Revised: 04/11/2019 Document Reviewed: 04/11/2019 Elsevier Patient Education  2021 Elsevier Inc.  

## 2020-12-03 DIAGNOSIS — Z20822 Contact with and (suspected) exposure to covid-19: Secondary | ICD-10-CM | POA: Diagnosis not present

## 2020-12-04 DIAGNOSIS — Z20822 Contact with and (suspected) exposure to covid-19: Secondary | ICD-10-CM | POA: Diagnosis not present

## 2021-01-15 ENCOUNTER — Telehealth (INDEPENDENT_AMBULATORY_CARE_PROVIDER_SITE_OTHER): Payer: BC Managed Care – PPO | Admitting: Primary Care

## 2021-01-15 DIAGNOSIS — K5900 Constipation, unspecified: Secondary | ICD-10-CM | POA: Diagnosis not present

## 2021-01-15 MED ORDER — LUBIPROSTONE 8 MCG PO CAPS: 8.0000 ug | ORAL_CAPSULE | Freq: Two times a day (BID) | ORAL | 1 refills | Status: DC

## 2021-01-15 NOTE — Progress Notes (Signed)
[facility] Telephone Note  I connected with Margaret Duncan, on 01/15/2021 at 10:06 AM through an audio and video application or by telephone and verified that I am speaking with the correct person using two identifiers.   Consent: I discussed the limitations, risks, security and privacy concerns of performing an evaluation and management service by telephone and the availability of in person appointments. I also discussed with the patient that there may be a patient responsible charge related to this service. The patient expressed understanding and agreed to proceed.   Location of Patient: Home  Location of Provider: [facility]   Persons participating in Telemedicine visit: Harvest Dark,  NP Cristela Felt , CMA  History of Present Illness: [name] is a 45 year old female who is having a tele visit for concern of pain in butt right. Unknown etiology she denies any falls or any trauma.  Question if history of internal or external hemorrhoids denied and or did she have problems with having bowel movements and the consistency of bowel movements and if she has to strain to have them.  The above questions were answered yes.  She has tried over-the-counter medication for constipation with little to no relief No past medical history on file. No Known Allergies  Current Outpatient Medications on File Prior to Visit  Medication Sig Dispense Refill   dicyclomine (BENTYL) 10 MG capsule Take 1 capsule (10 mg total) by mouth 3 (three) times daily before meals. 90 capsule 1   FLUoxetine (PROZAC) 10 MG capsule Take 1 capsule (10 mg total) by mouth daily. 90 capsule 0   loperamide (IMODIUM A-D) 2 MG tablet Take 1 tablet (2 mg total) by mouth 4 (four) times daily as needed for diarrhea or loose stools. 30 tablet 0   ondansetron (ZOFRAN) 4 MG tablet Take 1 tablet (4 mg total) by mouth every 8  (eight) hours as needed for nausea or vomiting. 20 tablet 0   pantoprazole (PROTONIX) 40 MG tablet Take 1 tablet (40 mg total) by mouth daily. 30 tablet 3   No current facility-administered medications on file prior to visit.    Observations/Objective: Pain on the right side of buttock cheek Constipation  Assessment and Plan: Diagnoses and all orders for this visit:  Constipation, unspecified constipation type Discussed increasing water try to drink 64 ounces daily, increase fiber decrease dairy products such as cheese.  We will prescribe medication.  If no improvement make an appointment in person visit.  Other orders -     Discontinue: lubiprostone (AMITIZA) 8 MCG capsule; Take 1 capsule (8 mcg total) by mouth 2 (two) times daily with a meal.    Follow Up Instructions:   I discussed the assessment and treatment plan with the patient. The patient was provided an opportunity to ask questions and all were answered. The patient agreed with the plan and demonstrated an understanding of the instructions.   The patient was advised to call back or seek an in-person evaluation if the symptoms worsen or if the condition fails to improve as anticipated.     I provided 10 minutes total of non-face-to-face time during this encounter including median intraservice time, reviewing previous notes, investigations, ordering medications, medical decision making, coordinating care and patient verbalized understanding at the end of the visit.    This note has been created with Education officer, environmental. Any transcriptional errors are unintentional.   Marcelino Duster P  Akua Blethen, NP 01/15/2021, 10:06 AM

## 2021-01-16 ENCOUNTER — Telehealth: Payer: Self-pay

## 2021-01-16 ENCOUNTER — Other Ambulatory Visit: Payer: Self-pay

## 2021-01-16 NOTE — Telephone Encounter (Signed)
Patient must have tried and failed Tr[facility] before Amitiza PA approval.  If appropriate can the Amitiza therapy be changed to [employer]

## 2021-01-17 ENCOUNTER — Other Ambulatory Visit (INDEPENDENT_AMBULATORY_CARE_PROVIDER_SITE_OTHER): Payer: Self-pay | Admitting: Primary Care

## 2021-01-17 DIAGNOSIS — K5900 Constipation, unspecified: Secondary | ICD-10-CM

## 2021-01-17 MED ORDER — TRULANCE 3 MG PO TABS: 3.0000 mg/kg/h | ORAL_TABLET | ORAL | 2 refills | Status: DC | PRN

## 2021-01-18 ENCOUNTER — Encounter (INDEPENDENT_AMBULATORY_CARE_PROVIDER_SITE_OTHER): Payer: Self-pay | Admitting: Primary Care

## 2021-01-27 ENCOUNTER — Telehealth: Payer: Self-pay

## 2021-01-27 ENCOUNTER — Other Ambulatory Visit: Payer: Self-pay

## 2021-01-27 NOTE — Telephone Encounter (Signed)
PA APPROVED FOR Elliot Gurney 01/26/2022

## 2021-02-05 ENCOUNTER — Telehealth (INDEPENDENT_AMBULATORY_CARE_PROVIDER_SITE_OTHER): Payer: BC Managed Care – PPO | Admitting: Primary Care

## 2021-02-13 ENCOUNTER — Telehealth: Payer: Self-pay

## 2021-02-13 ENCOUNTER — Other Ambulatory Visit: Payer: Self-pay | Admitting: Nurse Practitioner

## 2021-02-13 ENCOUNTER — Encounter: Payer: Self-pay | Admitting: Nurse Practitioner

## 2021-02-13 MED ORDER — SENNA 8.6 MG PO TABS: 2.0000 | ORAL_TABLET | Freq: Two times a day (BID) | ORAL | 1 refills | Status: AC

## 2021-02-13 NOTE — Telephone Encounter (Signed)
Discussed with Z. Meredeth Ide she will send Rx to correct pharmacy. Maryjean Morn, CMA    Copied from CRM [id number]. Topic: General - Inquiry >> Feb 13, 2021  2:03 PM Daphine Deutscher D wrote: Reason for CRM: Pt called asking if Gwinda Passe could give her something for constipation.    She would like to use [facility] [address]  [place]   CB#  [phone]

## 2021-03-25 ENCOUNTER — Other Ambulatory Visit (INDEPENDENT_AMBULATORY_CARE_PROVIDER_SITE_OTHER): Payer: Self-pay | Admitting: Primary Care

## 2021-03-25 DIAGNOSIS — F4323 Adjustment disorder with mixed anxiety and depressed mood: Secondary | ICD-10-CM

## 2021-03-25 MED ORDER — FLUOXETINE HCL 10 MG PO CAPS: 10.0000 mg | ORAL_CAPSULE | Freq: Every day | ORAL | 0 refills | Status: DC

## 2021-03-25 NOTE — Telephone Encounter (Signed)
Medication Refill - Medication: FLUoxetine (PROZAC) 10 MG capsule   Has the patient contacted their pharmacy? Yes.   (Agent: If no, request that the patient contact the pharmacy for the refill.) (Agent: If yes, when and what did the pharmacy advise?)  Preferred Pharmacy (with phone number or street name):  [id number] - [place], [place] - [address]  [address] [place] [address]  Phone: [phone] Fax: [phone]   Has the patient been seen for an appointment in the last year OR does the patient have an upcoming appointment? Yes.    Agent: Please be advised that RX refills may take up to 3 business days. We ask that you follow-up with your pharmacy.

## 2021-04-04 DIAGNOSIS — Z6822 Body mass index (BMI) 22.0-22.9, adult: Secondary | ICD-10-CM | POA: Diagnosis not present

## 2021-04-04 DIAGNOSIS — K644 Residual hemorrhoidal skin tags: Secondary | ICD-10-CM | POA: Diagnosis not present

## 2021-04-11 DIAGNOSIS — K5909 Other constipation: Secondary | ICD-10-CM | POA: Diagnosis not present

## 2021-04-11 DIAGNOSIS — K648 Other hemorrhoids: Secondary | ICD-10-CM | POA: Diagnosis not present

## 2021-04-11 DIAGNOSIS — R1013 Epigastric pain: Secondary | ICD-10-CM | POA: Diagnosis not present

## 2021-04-16 ENCOUNTER — Telehealth (INDEPENDENT_AMBULATORY_CARE_PROVIDER_SITE_OTHER): Payer: BC Managed Care – PPO | Admitting: Primary Care

## 2021-04-16 ENCOUNTER — Ambulatory Visit (INDEPENDENT_AMBULATORY_CARE_PROVIDER_SITE_OTHER): Payer: BC Managed Care – PPO | Admitting: Primary Care

## 2021-05-16 DIAGNOSIS — Z1211 Encounter for screening for malignant neoplasm of colon: Secondary | ICD-10-CM | POA: Diagnosis not present

## 2021-05-16 DIAGNOSIS — K648 Other hemorrhoids: Secondary | ICD-10-CM | POA: Diagnosis not present

## 2021-06-22 DIAGNOSIS — Z6821 Body mass index (BMI) 21.0-21.9, adult: Secondary | ICD-10-CM | POA: Diagnosis not present

## 2021-06-22 DIAGNOSIS — Z1211 Encounter for screening for malignant neoplasm of colon: Secondary | ICD-10-CM | POA: Diagnosis not present

## 2021-06-22 DIAGNOSIS — K648 Other hemorrhoids: Secondary | ICD-10-CM | POA: Diagnosis not present

## 2021-07-15 DIAGNOSIS — R6883 Chills (without fever): Secondary | ICD-10-CM | POA: Diagnosis not present

## 2021-07-15 DIAGNOSIS — Z20822 Contact with and (suspected) exposure to covid-19: Secondary | ICD-10-CM | POA: Diagnosis not present

## 2021-07-15 DIAGNOSIS — U071 COVID-19: Secondary | ICD-10-CM | POA: Diagnosis not present

## 2021-07-18 DIAGNOSIS — U071 COVID-19: Secondary | ICD-10-CM | POA: Diagnosis not present

## 2021-08-22 DIAGNOSIS — K648 Other hemorrhoids: Secondary | ICD-10-CM | POA: Diagnosis not present

## 2021-08-22 DIAGNOSIS — Z1211 Encounter for screening for malignant neoplasm of colon: Secondary | ICD-10-CM | POA: Diagnosis not present

## 2021-08-30 DIAGNOSIS — K635 Polyp of colon: Secondary | ICD-10-CM | POA: Diagnosis not present

## 2021-09-19 DIAGNOSIS — K648 Other hemorrhoids: Secondary | ICD-10-CM | POA: Diagnosis not present

## 2021-12-21 DIAGNOSIS — J209 Acute bronchitis, unspecified: Secondary | ICD-10-CM | POA: Diagnosis not present

## 2021-12-21 DIAGNOSIS — R059 Cough, unspecified: Secondary | ICD-10-CM | POA: Diagnosis not present

## 2022-01-14 ENCOUNTER — Other Ambulatory Visit: Payer: Self-pay

## 2022-01-15 ENCOUNTER — Telehealth: Payer: Self-pay

## 2022-01-15 NOTE — Telephone Encounter (Signed)
TRULANCE PA APPROVED UNTIL 01/13/23. [facility] PHARMACY NOTIFIED

## 2022-06-16 DIAGNOSIS — J209 Acute bronchitis, unspecified: Secondary | ICD-10-CM | POA: Diagnosis not present

## 2022-06-16 DIAGNOSIS — Z20822 Contact with and (suspected) exposure to covid-19: Secondary | ICD-10-CM | POA: Diagnosis not present
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
## 2022-09-30 ENCOUNTER — Ambulatory Visit (INDEPENDENT_AMBULATORY_CARE_PROVIDER_SITE_OTHER): Payer: 59 | Admitting: Primary Care

## 2022-09-30 ENCOUNTER — Encounter (INDEPENDENT_AMBULATORY_CARE_PROVIDER_SITE_OTHER): Payer: Self-pay | Admitting: Primary Care

## 2022-09-30 VITALS — BP 95/62 | HR 79 | Resp 16 | Ht 63.0 in | Wt 124.0 lb

## 2022-09-30 DIAGNOSIS — G4700 Insomnia, unspecified: Secondary | ICD-10-CM

## 2022-09-30 DIAGNOSIS — F4323 Adjustment disorder with mixed anxiety and depressed mood: Secondary | ICD-10-CM | POA: Diagnosis not present

## 2022-09-30 MED ORDER — FLUOXETINE HCL 10 MG PO CAPS: 10.0000 mg | ORAL_CAPSULE | Freq: Every day | ORAL | 1 refills | Status: DC

## 2022-09-30 NOTE — Patient Instructions (Signed)
Fluoxetine Capsules or Tablets (Depression/Mood Disorders) What is this medication? FLUOXETINE (floo OX e teen) treats depression, anxiety, obsessive-compulsive disorder (OCD), and eating disorders. It increases the amount of serotonin in the brain, a hormone that helps regulate mood. It belongs to a group of medications called SSRIs. This medicine may be used for other purposes; ask your health care provider or pharmacist if you have questions. COMMON BRAND NAME(S): Prozac What should I tell my care team before I take this medication? They need to know if you have any of these conditions: Bipolar disorder or a family history of bipolar disorder Bleeding disorders Glaucoma Heart disease Liver disease Low levels of sodium in the blood Seizures Suicidal thoughts, plans, or attempt; a previous suicide attempt by you or a family member Take MAOIs like Carbex, Eldepryl, Marplan, Nardil, and Parnate Take medications that treat or prevent blood clots Thyroid disease An unusual or allergic reaction to fluoxetine, other medications, foods, dyes, or preservatives Pregnant or trying to get pregnant Breast-feeding How should I use this medication? Take this medication by mouth with a glass of water. Follow the directions on the prescription label. You can take this medication with or without food. Take your medication at regular intervals. Do not take it more often than directed. Do not stop taking this medication suddenly except upon the advice of your care team. Stopping this medication too quickly may cause serious side effects or your condition may worsen. A special MedGuide will be given to you by the pharmacist with each prescription and refill. Be sure to read this information carefully each time. Talk to your care team about the use of this medication in children. While it may be prescribed for children as young as 7 years for selected conditions, precautions do apply. Overdosage: If you think  you have taken too much of this medicine contact a poison control center or emergency room at once. NOTE: This medicine is only for you. Do not share this medicine with others. What if I miss a dose? If you miss a dose, skip the missed dose and go back to your regular dosing schedule. Do not take double or extra doses. What may interact with this medication? Do not take this medication with any of the following: Other medications containing fluoxetine, like Sarafem or Symbyax Cisapride Dronedarone Linezolid MAOIs like Carbex, Eldepryl, Marplan, Nardil, and Parnate Methylene blue (injected into a vein) Pimozide Thioridazine This medication may also interact with the following: Alcohol Amphetamines Aspirin and aspirin-like medications Carbamazepine Certain medications for depression, anxiety, or psychotic disturbances Certain medications for migraine headaches like almotriptan, eletriptan, frovatriptan, naratriptan, rizatriptan, sumatriptan, zolmitriptan Digoxin Diuretics Fentanyl Flecainide Furazolidone Isoniazid Lithium Medications for sleep Medications that treat or prevent blood clots like warfarin, enoxaparin, and dalteparin NSAIDs, medications for pain and inflammation, like ibuprofen or naproxen Other medications that prolong the QT interval (an abnormal heart rhythm) Phenytoin Procarbazine Propafenone Rasagiline Ritonavir Supplements like St. John's wort, kava kava, valerian Tramadol Tryptophan Vinblastine This list may not describe all possible interactions. Give your health care provider a list of all the medicines, herbs, non-prescription drugs, or dietary supplements you use. Also tell them if you smoke, drink alcohol, or use illegal drugs. Some items may interact with your medicine. What should I watch for while using this medication? Tell your care team if your symptoms do not get better or if they get worse. Visit your care team for regular checks on your  progress. Because it may take several weeks to see the   full effects of this medication, it is important to continue your treatment as prescribed. Watch for new or worsening thoughts of suicide or depression. This includes sudden changes in mood, behavior, or thoughts. These changes can happen at any time but are more common in the beginning of treatment or after a change in dose. Call your care team right away if you experience these thoughts or worsening depression. Manic episodes may happen in patients with bipolar disorder who take this medication. Watch for changes in feelings or behaviors such as feeling anxious, nervous, agitated, panicky, irritable, hostile, aggressive, impulsive, severely restless, overly excited and hyperactive, or trouble sleeping. These symptoms can happen at anytime but are more common in the beginning of treatment or after a change in dose. Call your care team right away if you notice any of these symptoms. You may get drowsy or dizzy. Do not drive, use machinery, or do anything that needs mental alertness until you know how this medication affects you. Do not stand or sit up quickly, especially if you are an older patient. This reduces the risk of dizzy or fainting spells. Alcohol may interfere with the effect of this medication. Avoid alcoholic drinks. Your mouth may get dry. Chewing sugarless gum or sucking hard candy, and drinking plenty of water may help. Contact your care team if the problem does not go away or is severe. This medication may affect blood sugar levels. If you have diabetes, check with your care team before you make changes to your diet or medications. What side effects may I notice from receiving this medication? Side effects that you should report to your care team as soon as possible: Allergic reactions--skin rash, itching, hives, swelling of the face, lips, tongue, or throat Bleeding--bloody or black, tar-like stools, red or dark brown urine, vomiting  blood or brown material that looks like coffee grounds, small, red or purple spots on skin, unusual bleeding or bruising Heart rhythm changes--fast or irregular heartbeat, dizziness, feeling faint or lightheaded, chest pain, trouble breathing Loss of appetite with weight loss Low sodium level--muscle weakness, fatigue, dizziness, headache, confusion Serotonin syndrome--irritability, confusion, fast or irregular heartbeat, muscle stiffness, twitching muscles, sweating, high fever, seizure, chills, vomiting, diarrhea Sudden eye pain or change in vision such as blurry vision, seeing halos around lights, vision loss Thoughts of suicide or self-harm, worsening mood, feelings of depression Side effects that usually do not require medical attention (report to your care team if they continue or are bothersome): Anxiety, nervousness Change in sex drive or performance Diarrhea Dry mouth Headache Excessive sweating Nausea Tremors or shaking Trouble sleeping Upset stomach This list may not describe all possible side effects. Call your doctor for medical advice about side effects. You may report side effects to FDA at 1-800-FDA-1088. Where should I keep my medication? Keep out of the reach of children and pets. Store at room temperature between 15 and 30 degrees C (59 and 86 degrees F). Get rid of any unused medication after the expiration date. NOTE: This sheet is a summary. It may not cover all possible information. If you have questions about this medicine, talk to your doctor, pharmacist, or health care provider.  2023 Elsevier/Gold Standard (2020-06-23 00:00:00)

## 2022-09-30 NOTE — Progress Notes (Signed)
    [facility]        Subjective:     Margaret Duncan is a 47 y.o. female who complains of insomnia/depression . Onset was 2 months ago. Patient describes symptoms as early morning awakening, frequent night time awakening, and difficulty falling asleep.  None she requesting to have Prozac refilled  Associated symptoms include: anxiety and depression. Patient denies frequent nighttime urination, leg cramps, restless legs, and snoring. Symptoms have gradually worsened. Patient has No headache, No chest pain, No abdominal pain - No Nausea, No new weakness tingling or numbness, No Cough - shortness of breath   No past medical history on file.   Review of Systems Pertinent items noted in HPI and remainder of comprehensive ROS otherwise negative.    Objective:  Blood Pressure 95/62   Pulse 79   Respiration 16   Height 5\' 3"  (1.6 m)   Weight 124 lb (56.2 kg)   Oxygen Saturation 99%   Body Mass Index 21.97 kg/m     General: No apparent distress. Eyes: Extraocular eye movements intact, pupils equal and round. Neck: Supple, trachea midline. Thyroid: No enlargement, mobile without fixation, no tenderness. Cardiovascular: Regular rhythm and rate, no murmur, normal radial pulses. Respiratory: Normal respiratory effort, clear to auscultation. Gastrointestinal: Normal pitch active bowel sounds, nontender abdomen without distention or appreciable hepatomegaly. Musculoskeletal: Normal muscle tone, no tenderness on palpation of tibia, no excessive thoracic kyphosis. Skin: Appropriate warmth, no visible rash. Mental status: Alert, conversant, speech clear, thought logical, appropriate mood and affect, no hallucinations or delusions evident. Hematologic/lymphatic: No cervical adenopathy, no visible ecchymoses.  Assessment:  Margaret Duncan was seen today for medication refill.  Diagnoses and all orders for this visit:  Adjustment disorder with mixed anxiety and depressed mood -      FLUoxetine (PROZAC) 10 MG capsule; Take 1 capsule (10 mg total) by mouth daily.  Insomnia  Discussed sleep hygiene measures including regular sleep schedule, optimal sleep environment, and relaxing presleep rituals. Avoid daytime naps. Avoid caffeine after noon. Avoid excess alcohol. Avoid tobacco. Recommended daily exercise.   This note has been created with Surveyor, quantity. Any transcriptional errors are unintentional.   Kerin Perna, NP 09/30/2022, 11:43 AM

## 2022-11-11 ENCOUNTER — Ambulatory Visit (INDEPENDENT_AMBULATORY_CARE_PROVIDER_SITE_OTHER): Payer: 59 | Admitting: Primary Care

## 2022-11-25 ENCOUNTER — Encounter (INDEPENDENT_AMBULATORY_CARE_PROVIDER_SITE_OTHER): Payer: Self-pay | Admitting: Primary Care

## 2022-11-25 ENCOUNTER — Ambulatory Visit (INDEPENDENT_AMBULATORY_CARE_PROVIDER_SITE_OTHER): Payer: 59 | Admitting: Primary Care

## 2022-11-25 VITALS — BP 122/75 | HR 82 | Resp 16 | Wt 122.8 lb

## 2022-11-25 DIAGNOSIS — G4700 Insomnia, unspecified: Secondary | ICD-10-CM

## 2022-11-25 DIAGNOSIS — F4323 Adjustment disorder with mixed anxiety and depressed mood: Secondary | ICD-10-CM | POA: Diagnosis not present

## 2022-11-25 DIAGNOSIS — K648 Other hemorrhoids: Secondary | ICD-10-CM

## 2022-11-25 DIAGNOSIS — K5900 Constipation, unspecified: Secondary | ICD-10-CM

## 2022-11-25 DIAGNOSIS — R519 Headache, unspecified: Secondary | ICD-10-CM

## 2022-11-25 MED ORDER — IBUPROFEN 400 MG PO TABS: 400.0000 mg | ORAL_TABLET | Freq: Three times a day (TID) | ORAL | 1 refills | Status: AC | PRN

## 2022-11-25 MED ORDER — HYDROCORTISONE (PERIANAL) 2.5 % EX CREA: 1.0000 | TOPICAL_CREAM | Freq: Two times a day (BID) | CUTANEOUS | 0 refills | Status: AC

## 2022-11-25 MED ORDER — ESCITALOPRAM OXALATE 5 MG PO TABS: 5.0000 mg | ORAL_TABLET | Freq: Every day | ORAL | 1 refills | Status: AC

## 2022-11-25 NOTE — Progress Notes (Signed)
   Established Patient Office Visit  Subjective   Patient ID: Margaret Duncan    DOB: Aug 13, 1975  Age: 47 y.o. MRN: [id number]  HPI  [name] is a 47 year old female following up on how her medication is working since last visit. Headaches for a month unable to identify causative factors. Patient has No chest pain, No abdominal pain - No Nausea, No new weakness tingling or numbness, No Cough - shortness of breath. She also voices anal pain.  Having problems with depression and anxiety situational.  She stopped the last medication Prozac due to causing her to have suicidal ideations.  Explained that was an appropriate choice but also needed to make writer aware.   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  Constitutional:      Appearance: Normal appearance. She is normal weight.  HENT:     Head: Normocephalic.     Right Ear: External ear normal.     Left Ear: External ear normal.     Nose: Nose normal.  Eyes:     Extraocular Movements: Extraocular movements intact.  Cardiovascular:     Rate and Rhythm: Normal rate and regular rhythm.  Pulmonary:     Breath sounds: Normal breath sounds.  Abdominal:     General: Abdomen is flat.     Palpations: Abdomen is soft.  Musculoskeletal:        General: Normal range of motion.     Cervical back: Normal range of motion.  Skin:    General: Skin is warm and dry.  Neurological:     Mental Status: She is alert and oriented to person, place, and time.   Psychiatric:        Mood and Affect: Mood normal.        Behavior: Behavior normal.       Assessment & Plan:  Ward was seen today for anxiety and depression.  Diagnoses and all orders for this visit:  Internal hemorrhoid 2/2 Constipation, unspecified constipation type -     hydrocortisone (ANUSOL-HC) 2.5 % rectal cream; Place 1 Application rectally 2 (two) times daily. OTC senokot S -     Ambulatory referral to General Surgery  Adjustment disorder with mixed anxiety and depressed mood 2/2 Insomnia, unspecified type -     escitalopram (LEXAPRO) 5 MG tablet; Take 1 tablet (5 mg total) by mouth daily.    Nonintractable headache, unspecified chronicity pattern, unspecified headache type -     ibuprofen (ADVIL) 400 MG tablet; Take 1 tablet (400 mg total) by mouth every 8 (eight) hours as needed.    No follow-ups on file.    Grayce Sessions, NP

## 2022-12-07 ENCOUNTER — Ambulatory Visit (INDEPENDENT_AMBULATORY_CARE_PROVIDER_SITE_OTHER): Payer: Self-pay | Admitting: *Deleted

## 2022-12-07 NOTE — Telephone Encounter (Signed)
Medication Question.   Pt is calling in because she says her medication escitalopram (LEXAPRO) 5 MG tablet [[id number]] was giving her anxiety when she took it at night, and wants to know if she can take it during the day.     Chief Complaint: Medication Question Symptoms: States taking Escitalopram at HS  "Makes me anxious." States has only taken the one dose. Frequency: One dose only Pertinent Negatives: Patient denies  Disposition: [] ED /[] Urgent Care (no appt availability in office) / [] Appointment(In office/virtual)/ []  [facility]/ [x] Home Care/ [] Refused Recommended Disposition /[] [facility]/ []  Follow-up with PCP Additional Notes: Pt states pharmacist told her it would be ok to take in mornings. Advised may make her sleepy but to monitor how she feels taking in mornings.   Pt states she is aware she is not getting effects of med as of yet. Advised to [facility] for any additional questions, issues. Pt verbalizes understanding.  Reason for Disposition  Caller has medicine question only, adult not sick, AND triager answers question  Answer Assessment - Initial Assessment Questions 1. NAME of MEDICINE: "What medicine(s) are you calling about?"     Escitalopram 2. QUESTION: "What is your question?" (e.g., double dose of medicine, side effect)     "Can I take it during the day instead of night?" 3. PRESCRIBER: "Who prescribed the medicine?" Reason: if prescribed by specialist, call should be referred to that group.     PCP 4. SYMPTOMS: "Do you have any symptoms?" If Yes, ask: "What symptoms are you having?"  "How bad are the symptoms (e.g., mild, moderate, severe)     Anxiety  Protocols used: Medication Question Call-A-AH

## 2022-12-09 ENCOUNTER — Ambulatory Visit (INDEPENDENT_AMBULATORY_CARE_PROVIDER_SITE_OTHER): Payer: 59 | Admitting: Licensed Clinical Social Worker

## 2022-12-09 DIAGNOSIS — F4323 Adjustment disorder with mixed anxiety and depressed mood: Secondary | ICD-10-CM | POA: Diagnosis not present

## 2022-12-13 ENCOUNTER — Encounter (INDEPENDENT_AMBULATORY_CARE_PROVIDER_SITE_OTHER): Payer: Self-pay

## 2022-12-13 ENCOUNTER — Telehealth (INDEPENDENT_AMBULATORY_CARE_PROVIDER_SITE_OTHER): Payer: Self-pay | Admitting: Licensed Clinical Social Worker

## 2022-12-13 NOTE — Telephone Encounter (Signed)
LCSWA called patient today to introduce herself and to assess patients' mental health needs. Patient did not answer the phone. Patient was referred by PCP for Adjustment disorder with mixed anxiety and depressed mood.

## 2022-12-20 NOTE — BH Specialist Note (Unsigned)
Integrated Behavioral Health Initial In-Person Visit  MRN: [id number] Name: Janann Boeve  Number of Integrated Behavioral Health Clinician visits: 1- Initial Visit  Session Start time: 0913    Session End time: 1007  Total time in minutes: 54   Types of Service: Individual psychotherapy and Introduction only  Interpretor:No. Interpretor Name and Language: n/a   Warm Hand Off Completed.    Subjective: Margaret Duncan is a 47 y.o. female accompanied by  herself Patient was referred by PCP for anxiety. Patient reports the following symptoms/concerns: panic attacks, sadness, very anxious, over thinking  Duration of problem: for years but more so past year; Severity of problem: moderate  Objective: Mood: Anxious and Affect: Appropriate and Tearful Risk of harm to self or others: No plan to harm self or others  Life Context: Family and Social: pt lives with her husband and children. School/Work: pt did not mention work Self-Care: pt enjoys working out  Life Changes: peri menopause, kids growing up   Patient and/or Family's Strengths/Protective Factors: Concrete supports in place (healthy food, safe environments, etc.)  Goals Addressed: Patient will: Reduce symptoms of: anxiety Increase knowledge and/or ability of: coping skills and healthy habits  Demonstrate ability to: Increase motivation to adhere to plan of care  Progress towards Goals: Ongoing  Interventions: Interventions utilized: Mindfulness or Relaxation Training and Supportive Counseling  Standardized Assessments completed: GAD-7 and PHQ 9  Patient and/or Family Response: pt shared that she has been experiencing a lot anxiety lately, reports that she often thinks something bad will happen and is not getting much sleep at night. Pt is very receptive to learning ways to cope with her anxiety and emotions.   Patient Centered Plan: Patient is on the following Treatment Plan(s):  Anxiety    Assessment: Patient currently experiencing not sleeping at night, anxious and worried about a lot of things, over thinking, often sad.   Patient may benefit from continued support from [employer], working out, taking cherry tart juice 100%, relaxation music, diffuser, medication, magnesium g.  Plan: Follow up with behavioral health clinician on : 4 weeks Behavioral recommendations: continued support from [employer], working out, taking cherry tart juice 100%, relaxation music, diffuser, medication, magnesium g. Referral(s): Integrated [employer] (In Clinic) and Psychiatrist "From scale of 1-10, how likely are you to follow plan?": 10  Vassie Loll, [address]

## 2022-12-27 DIAGNOSIS — F4323 Adjustment disorder with mixed anxiety and depressed mood: Secondary | ICD-10-CM | POA: Insufficient documentation

## 2023-01-03 ENCOUNTER — Other Ambulatory Visit: Payer: Self-pay

## 2023-01-05 ENCOUNTER — Other Ambulatory Visit: Payer: Self-pay

## 2023-01-13 ENCOUNTER — Encounter (INDEPENDENT_AMBULATORY_CARE_PROVIDER_SITE_OTHER): Payer: Self-pay | Admitting: Primary Care

## 2023-01-13 ENCOUNTER — Ambulatory Visit (INDEPENDENT_AMBULATORY_CARE_PROVIDER_SITE_OTHER): Payer: 59 | Admitting: Primary Care

## 2023-01-13 ENCOUNTER — Ambulatory Visit: Payer: 59 | Attending: Primary Care

## 2023-01-13 ENCOUNTER — Ambulatory Visit (INDEPENDENT_AMBULATORY_CARE_PROVIDER_SITE_OTHER): Payer: 59 | Admitting: Licensed Clinical Social Worker

## 2023-01-13 VITALS — BP 98/66 | HR 79 | Resp 16 | Wt 116.0 lb

## 2023-01-13 DIAGNOSIS — F4323 Adjustment disorder with mixed anxiety and depressed mood: Secondary | ICD-10-CM

## 2023-01-13 DIAGNOSIS — N959 Unspecified menopausal and perimenopausal disorder: Secondary | ICD-10-CM

## 2023-01-13 NOTE — Progress Notes (Signed)
[facility]  Margaret Duncan, is a 47 y.o. female  [id number]  [id number]  DOB - [birth year]  Chief Complaint  Patient presents with   Anxiety   Depression       Subjective:   Margaret Duncan is a 47 y.o. female here today for a follow up visit for anxiety and depression.  She was and that she may be premenopausal is causing anxiety and depression.  PerOB/GYN suggested patient stated OB/GYN suggested primary care check labs and level.  Question when her mother had menopause during her 44s.  She denies any suicidal or homicidal ideation.  She has No headache, No chest pain, No abdominal pain - No Nausea, No new weakness tingling or numbness, No Cough - shortness of breath  No problems updated.  No Known Allergies  No past medical history on file.  Current Outpatient Medications on File Prior to Visit  Medication Sig Dispense Refill   escitalopram (LEXAPRO) 5 MG tablet Take 1 tablet (5 mg total) by mouth daily. 30 tablet 1   hydrocortisone (ANUSOL-HC) 2.5 % rectal cream Place 1 Application rectally 2 (two) times daily. 30 g 0   ibuprofen (ADVIL) 400 MG tablet Take 1 tablet (400 mg total) by mouth every 8 (eight) hours as needed. 60 tablet 1   No current facility-administered medications on file prior to visit.    Objective:   Vitals:   01/13/23 1009  BP: 98/66  Pulse: 79  Resp: 16  SpO2: 100%  Weight: 116 lb (52.6 kg)    Comprehensive ROS Pertinent positive and negative noted in HPI   Exam General appearance : Awake, alert, not in any distress. Speech Clear. Not toxic looking HEENT: Atraumatic and Normocephalic, pupils equally reactive to light and accomodation Neck: Supple, no JVD. No cervical lymphadenopathy.  Chest: Good air entry bilaterally, no added sounds  CVS: S1 S2 regular, no murmurs.  Abdomen: Bowel sounds present, Non tender and not distended with no gaurding, rigidity or rebound. Extremities: B/L Lower Ext shows no  edema, both legs are warm to touch Neurology: Awake alert, and oriented X 3, CN II-XII intact, Non focal Skin: No Rash  Data Review No results found for: "HGBA1C"  Assessment & Plan  Shaquandra was seen today for anxiety and depression.  Diagnoses and all orders for this visit:  Premenopausal patient -     FSH/LH -     Testosterone -     Estrogens, total -     Progesterone -     Cancel: Anti mullerian hormone -     TSH  Adjustment disorder with mixed anxiety and depressed mood Continue escitalopram 5 mg daily.  Not a as needed medication Refer to clinical social worker  Patient have been counseled extensively about nutrition and exercise. Other issues discussed during this visit include: low cholesterol diet, weight control and daily exercise, foot care, annual eye examinations at Ophthalmology, importance of adherence with medications and regular follow-up. We also discussed long term complications of uncontrolled diabetes and hypertension.   No follow-ups on file.  The patient was given clear instructions to go to ER or return to medical center if symptoms don't improve, worsen or new problems develop. The patient verbalized understanding. The patient was told to call to get lab results if they haven't heard anything in the next week.   This note has been created with Education officer, environmental. Any transcriptional errors are unintentional.   Grayce Sessions, NP 01/17/2023,  6:40 PM

## 2023-01-13 NOTE — BH Specialist Note (Signed)
952

## 2023-01-14 LAB — TSH: TSH: 0.993 u[IU]/mL (ref 0.450–4.500)

## 2023-01-14 LAB — TESTOSTERONE: Testosterone: 14 ng/dL (ref 4–50)

## 2023-01-14 LAB — FSH/LH: FSH: 45.3 m[IU]/mL

## 2023-01-14 LAB — ESTROGENS, TOTAL

## 2023-01-16 LAB — PROGESTERONE: Progesterone: 0.6 ng/mL

## 2023-01-16 LAB — FSH/LH: LH: 44.8 m[IU]/mL

## 2023-01-17 NOTE — BH Specialist Note (Signed)
Integrated Behavioral Health Follow Up In-Person Visit  MRN: [id number] Name: Margaret Duncan  Number of Integrated Behavioral Health Clinician visits: 2- Second Visit  Session Start time: [phone]   Session End time: 3329  Total time in minutes: 42   Types of Service: Individual psychotherapy  Interpretor:No. Interpretor Name and Language: n/a  Subjective: Margaret Duncan is a 47 y.o. female accompanied by  herself. Patient was referred by PCP for anxiety. Patient reports the following symptoms/concerns: panic attacks, sadness, very anxious, over thinking  Duration of problem: for years but more so past year; Severity of problem: moderate  Objective: Mood: Euthymic and Affect: Appropriate Risk of harm to self or others: No plan to harm self or others  Life Context: Family and Social: pt lives with her husband and children. School/Work: pt did not mention work Self-Care: pt enjoys working out  Life Changes: peri menopause, kids growing up   Patient and/or Family's Strengths/Protective Factors: Concrete supports in place (healthy food, safe environments, etc.) and Physical Health (exercise, healthy diet, medication compliance, etc.)  Goals Addressed: Patient will:  Reduce symptoms of: anxiety   Increase knowledge and/or ability of: coping skills and healthy habits   Demonstrate ability to: Increase motivation to adhere to plan of care  Progress towards Goals: Ongoing  Interventions: Interventions utilized:  Mindfulness or Relaxation Training and Supportive Counseling Standardized Assessments completed: GAD-7 and PHQ 9  Patient and/or Family Response: Pt shared that she has stopped self medicating, drinking and also stop taking the anxiety medication and she has noticed a big difference. She shared that she is sleeping a little better, doesn't seem as irritable, has a little more energy and makes her self not lay in med often.   Patient Centered Plan: Patient is on the  following Treatment Plan(s): Anxiety  Assessment: Patient currently experiencing reduced symptoms of depression. Reports not crying as much.  Patient may benefit from continued support form [facility] and journaling.  Plan: Follow up with behavioral health clinician on : 4 weeks Behavioral recommendations: journaling  Referral(s): Integrated [employer] (In Clinic) "From scale of 1-10, how likely are you to follow plan?": 10  Vassie Loll, [address]

## 2023-02-16 ENCOUNTER — Telehealth (INDEPENDENT_AMBULATORY_CARE_PROVIDER_SITE_OTHER): Payer: Self-pay | Admitting: Licensed Clinical Social Worker

## 2023-02-16 NOTE — Telephone Encounter (Signed)
Patient called to change her appointment because she has recently started work. LCSWA was able to change her appointment.

## 2023-02-17 ENCOUNTER — Ambulatory Visit (INDEPENDENT_AMBULATORY_CARE_PROVIDER_SITE_OTHER): Payer: 59 | Admitting: Licensed Clinical Social Worker

## 2023-03-31 ENCOUNTER — Ambulatory Visit (INDEPENDENT_AMBULATORY_CARE_PROVIDER_SITE_OTHER): Payer: 59 | Admitting: Licensed Clinical Social Worker

## 2024-03-28 ENCOUNTER — Encounter: Payer: Self-pay | Admitting: Obstetrics and Gynecology

## 2024-03-28 ENCOUNTER — Other Ambulatory Visit (HOSPITAL_COMMUNITY)
Admission: RE | Admit: 2024-03-28 | Discharge: 2024-03-28 | Disposition: A | Discharge status: Home / Self Care | Source: Ambulatory Visit | Attending: Obstetrics and Gynecology | Admitting: Obstetrics and Gynecology

## 2024-03-28 ENCOUNTER — Ambulatory Visit (INDEPENDENT_AMBULATORY_CARE_PROVIDER_SITE_OTHER): Admitting: Obstetrics and Gynecology

## 2024-03-28 VITALS — BP 93/51 | HR 61 | Ht 63.0 in | Wt 110.1 lb

## 2024-03-28 DIAGNOSIS — Z1231 Encounter for screening mammogram for malignant neoplasm of breast: Secondary | ICD-10-CM

## 2024-03-28 DIAGNOSIS — Z124 Encounter for screening for malignant neoplasm of cervix: Secondary | ICD-10-CM | POA: Insufficient documentation

## 2024-03-28 DIAGNOSIS — Z01419 Encounter for gynecological examination (general) (routine) without abnormal findings: Secondary | ICD-10-CM

## 2024-03-28 NOTE — Progress Notes (Signed)
 ANNUAL GYNECOLOGY VISIT Chief Complaint  Patient presents with   Gynecologic Exam    Annual with pap      Subjective:  Margaret Duncan is a 48 y.o. G2P2000 who presents for annual exam.  No concerns.  Gyn History: Patient's last menstrual period was 03/22/2024 (exact date). Contraception: tubal History of STIs: No Last pap: 2024, no records History of abnormal pap: yes but no procedures Periods: regular  Last mammogram: 2024 Last colonoscopy: 2023     The pregnancy intention screening data noted above was reviewed. Potential methods of contraception were discussed. The patient elected to proceed with No data recorded.       03/28/2024    4:23 PM 01/13/2023   11:17 AM 12/09/2022    5:08 PM 12/09/2022    4:50 PM 11/25/2022   11:28 AM  Depression screen PHQ 2/9  Decreased Interest 0 0 0 0 1  Down, Depressed, Hopeless 0 0   1  PHQ - 2 Score 0 0 0 0 2  Altered sleeping 1 3 3 3 3   Tired, decreased energy 1 1 1 1 3   Change in appetite 0 0 1 1 0  Feeling bad or failure about yourself  0 0 3 3 1   Trouble concentrating 0 0 3 3 1   Moving slowly or fidgety/restless 0 0 3 3 0  Suicidal thoughts 0 0 0 0 0  PHQ-9 Score 2 4 14 14 10   Difficult doing work/chores   Somewhat difficult Somewhat difficult         03/28/2024    4:23 PM 01/13/2023   11:17 AM 12/09/2022    5:08 PM 12/09/2022    4:51 PM  GAD 7 : Generalized Anxiety Score  Nervous, Anxious, on Edge 0  3 3  Control/stop worrying 0 1 3 3   Worry too much - different things 0 0 3 3  Trouble relaxing 0 0 3 3  Restless 0 0 3 3  Easily annoyed or irritable 0 1 3 3   Afraid - awful might happen 0 0 3 3  Total GAD 7 Score 0  21 21  Anxiety Difficulty    Very difficult      OB History     Gravida  2   Para  2   Term  2   Preterm      AB      Living         SAB      IAB      Ectopic      Multiple      Live Births  2           No past medical history on file.  Past Surgical History:   Procedure Laterality Date   CESAREAN SECTION      Social History   Socioeconomic History   Marital status: Married    Spouse name: Not on file   Number of children: Not on file   Years of education: Not on file   Highest education level: Not on file  Occupational History   Not on file  Tobacco Use   Smoking status: Never   Smokeless tobacco: Never  Substance and Sexual Activity   Alcohol use: Never   Drug use: Never   Sexual activity: Yes    Birth control/protection: None  Other Topics Concern   Not on file  Social History Narrative   Not on file   Social Drivers of Health   Financial Resource Strain:  Not on file  Food Insecurity: Not on file  Transportation Needs: Not on file  Physical Activity: Not on file  Stress: Not on file  Social Connections: Not on file    Family History  Problem Relation Age of Onset   Breast cancer Paternal Grandmother    Breast cancer Paternal Aunt    Breast cancer Paternal Aunt     Current Outpatient Medications on File Prior to Visit  Medication Sig Dispense Refill   escitalopram  (LEXAPRO ) 5 MG tablet Take 1 tablet (5 mg total) by mouth daily. (Patient not taking: Reported on 03/28/2024) 30 tablet 1   hydrocortisone  (ANUSOL -HC) 2.5 % rectal cream Place 1 Application rectally 2 (two) times daily. (Patient not taking: Reported on 03/28/2024) 30 g 0   ibuprofen  (ADVIL ) 400 MG tablet Take 1 tablet (400 mg total) by mouth every 8 (eight) hours as needed. (Patient not taking: Reported on 03/28/2024) 60 tablet 1   No current facility-administered medications on file prior to visit.    No Known Allergies   Objective:   Vitals:   03/28/24 1613  BP: (!) 93/51  Pulse: 61  Weight: 110 lb 1.3 oz (49.9 kg)  Height: 5' 3 (1.6 m)   Physical Examination:   General appearance - well appearing, and in no distress  Mental status - alert, oriented to person, place, and time  Psych:  normal mood and affect  Skin - warm and dry, normal  color, no suspicious lesions noted  Breasts - breasts appear normal, no suspicious masses, no skin or nipple changes or  axillary nodes  Abdomen - soft, nontender, nondistended, no masses or organomegaly  Pelvic -  VULVA: normal appearing vulva with no masses, tenderness or lesions   VAGINA: normal appearing vagina with normal color and discharge, no lesions   CERVIX: normal appearing cervix without discharge or lesions, no CMT  Thin prep pap is done with HR HPV cotesting  UTERUS: uterus is felt to be normal size, shape, consistency and nontender   ADNEXA: No adnexal masses or tenderness noted.  Extremities:  No swelling or varicosities noted  Chaperone present for exam  Assessment and Plan:  1. Encounter for well woman exam with routine gynecological exam (Primary) Pap/HPV Mammo ordered Colonoscopy up to date Tubal for contraception  2. Cervical cancer screening - Cytology - PAP  3. Encounter for screening mammogram for malignant neoplasm of breast - MM 3D SCREENING MAMMOGRAM BILATERAL BREAST; Future   Future Appointments  Date Time Provider Department Center  04/03/2024  3:30 PM Price, Celena BROCKS, PT [facility] None    Rollo ONEIDA Bring, MD, FACOG Obstetrician & Gynecologist, [facility] for [facility], [facility]

## 2024-03-28 NOTE — Progress Notes (Signed)
 Patient presents for Annual.  LMP: Patient's last menstrual period was 03/22/2024 (exact date).  Last pap: 11/2022 Contraception: Tubal  Mammogram: 03/23/2019  STD Screening: Declines Flu Vaccine : Declines  CC:   Fun Fact:

## 2024-03-30 LAB — CYTOLOGY - PAP
Comment: NEGATIVE
Diagnosis: NEGATIVE
High risk HPV: NEGATIVE

## 2024-03-31 ENCOUNTER — Ambulatory Visit: Payer: Self-pay | Admitting: Obstetrics and Gynecology

## 2024-04-03 ENCOUNTER — Other Ambulatory Visit: Payer: Self-pay

## 2024-04-03 ENCOUNTER — Ambulatory Visit: Attending: General Surgery | Admitting: Physical Therapy

## 2024-04-03 ENCOUNTER — Encounter: Payer: Self-pay | Admitting: Physical Therapy

## 2024-04-03 DIAGNOSIS — M62838 Other muscle spasm: Secondary | ICD-10-CM | POA: Insufficient documentation

## 2024-04-03 DIAGNOSIS — R279 Unspecified lack of coordination: Secondary | ICD-10-CM | POA: Diagnosis present

## 2024-04-03 DIAGNOSIS — R293 Abnormal posture: Secondary | ICD-10-CM | POA: Insufficient documentation

## 2024-04-03 NOTE — Therapy (Signed)
 OUTPATIENT PHYSICAL THERAPY FEMALE PELVIC EVALUATION   Patient Name: Margaret Duncan MRN: [id number] DOB:1976-02-07, 48 y.o., female Today's Date: 04/03/2024  END OF SESSION:  PT End of Session - 04/03/24 1614     Visit Number 1    Number of Visits 9    Date for Recertification  07/04/24    Authorization Type [employer] Managed    PT Start Time [id number]    PT Stop Time 0330    PT Time Calculation (min) 45 min    Activity Tolerance Patient tolerated treatment well    Behavior During Therapy [facility] for tasks assessed/performed          History reviewed. No pertinent past medical history. Past Surgical History:  Procedure Laterality Date   CESAREAN SECTION     Patient Active Problem List   Diagnosis Date Noted   Adjustment disorder with mixed anxiety and depressed mood 12/27/2022   Candidiasis of vagina 10/23/2020   Irritable bowel syndrome with diarrhea 10/23/2020   PCP: Celestia Rosaline SQUIBB, NP  REFERRING PROVIDER: Debby Hila, MD  REFERRING DIAG: [phone] (ICD-10-CM) - Other muscle spasm   THERAPY DIAG:  Other muscle spasm  Unspecified lack of coordination  Abnormal posture  Rationale for Evaluation and Treatment: Rehabilitation  ONSET DATE: a couple of years ago   SUBJECTIVE:                                                                                                                                                                                           SUBJECTIVE STATEMENT: Patient reports to PFPT with anal pain that she thought was hemorrhoids. Her left glute will also go into spasm and have pain. Her doctor ruled out hemorrhoids.  Fluid intake: trying to drink more and eat more fiber right now due to constipation   FUNCTIONAL LIMITATIONS: sitting/standing for long periods of time   PERTINENT HISTORY:  Medications for current condition: none  PAIN:  Are you having pain? No NPRS scale: 0/10 at rest currently, over 10/10 when flared  Pain location:  External, Left, and Anal  Pain type: aching, sharp, and tight Pain description: intermittent, constant, and dull   Aggravating factors: bowel movements, sitting for long periods of time, laying down for long periods of time  Relieving factors: Advil    PRECAUTIONS: None  RED FLAGS: None   WEIGHT BEARING RESTRICTIONS: No  FALLS:  Has patient fallen in last 6 months? 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; up and down throughout the day   ACTIVITY LEVEL : low   PLOF: Independent  PATIENT GOALS: decrease the pain at the anus and in the glute  BOWEL MOVEMENT: Pain with bowel movement: No Type of bowel movement:Type (Bristol Stool Scale) 4, Frequency daily, Strain sometimes, and Splinting no Fully empty rectum: No Leakage: No                                                     Caused by:  Pads: No Fiber supplement/laxative No  URINATION: Pain with urination: No Fully empty bladder: Yes:                                  Post-void dribble: No Stream: Strong Urgency: Yes  Frequency:during the day within normal limits                                                          Nocturia: Yes: 1x night    Leakage: none Pads/briefs: No  INTERCOURSE: no pain with intercourse currently, currently sexually active   Ability to have vaginal penetration Yes  Pain with intercourse: none Dryness: No Climax: yes Marinoff Scale: 0/3 Lubricant: no  PREGNANCY: Vaginal deliveries 0 C-section deliveries 2 Currently pregnant No  PROLAPSE: None  OBJECTIVE:  Note: Objective measures were completed at Evaluation unless otherwise noted.  PATIENT SURVEYS:  PFIQ-7:45   COGNITION: Overall cognitive status: Within functional limits for tasks assessed     SENSATION: Light touch: Appears intact  LUMBAR SPECIAL TESTS:  Single leg stance test: Positive  FUNCTIONAL TESTS:  Single leg stance:  Rt: +  Lt: +  Sit-up test: 1/3  Squat: dynamic knee valgus with loading,  stiffness Bed mobility: within normal limits   GAIT: Assistive device utilized: None Comments: moderate trendelenburg gait pattern with ambulation   POSTURE: rounded shoulders and forward head  LUMBARAROM/PROM: within normal limits for all motions tested bilaterally with no pelvic pain   LOWER EXTREMITY ROM: within functional limits for all motions tested bilaterally with no pain   LOWER EXTREMITY MMT: 4/5 bilateral knees and hips grossly   PALPATION:  General: no tenderness to palpation of bilateral hip flexors or adductors in hooklying   Pelvic Alignment: within normal limits   Abdominal: bracing at rest  Diastasis: Yes: at umbilicus Distortion: No  Breathing: apical breathing pattern with decreased lower rib excursion with inhalation  Scar tissue: No                External Perineal Exam: mild dryness present with anal wink reflex intact                              Internal Pelvic Floor: Patient fully consents to today's internal rectal examination. She demonstrates increased muscle tone at internal and external anal sphincter, limiting depth of examination due to tension levels. She has palpable trigger points throughout the rectum and was in discomfort during the exam with movement in the rectal canal, similar to pain she feels when she has a flare. Levator ani bilaterally was very tense to palpation.   Patient confirms identification and approves PT to assess internal pelvic floor and treatment Yes No emotional/communication barriers or cognitive  limitation. Patient is motivated to learn. Patient understands and agrees with treatment goals and plan. PT explains patient will be examined in standing, sitting, and lying down to see how their muscles and joints work. When they are ready, they will be asked to remove their underwear so PT can examine their perineum. The patient is also given the option of providing their own chaperone as one is not provided in our facility. The  patient also has the right and is explained the right to defer or refuse any part of the evaluation or treatment including the internal exam. With the patient's consent, PT will use one gloved finger to gently assess the muscles of the pelvic floor, seeing how well it contracts and relaxes and if there is muscle symmetry. After, the patient will get dressed and PT and patient will discuss exam findings and plan of care. PT and patient discuss plan of care, schedule, attendance policy and HEP activities.  PELVIC MMT:   MMT eval  Vaginal   Internal Anal Sphincter 5/5  External Anal Sphincter 5/5  Puborectalis 5/5  Diastasis Recti At umbilicus   (Blank rows = not tested)       TONE: High in bilateral aspects of rectal canal   PROLAPSE: N/A  TODAY'S TREATMENT:                                                                                                                              DATE:   EVAL 04/03/24: Examination completed, findings reviewed, pt educated on POC, HEP, and self care. Pt motivated to participate in PT and agreeable to attempt recommendations.     PATIENT EDUCATION:  Education details: relative anatomy and the connection between the diaphragm and pelvic floor, water intake and bladder irritant education  Person educated: Patient Education method: Explanation, Demonstration, Tactile cues, Verbal cues, and Handouts Education comprehension: verbalized understanding, returned demonstration, verbal cues required, tactile cues required, and needs further education  HOME EXERCISE PROGRAM: Access Code: [email or web address] URL: [email or web address] Date: 04/03/2024 Prepared by: Celena Domino  Exercises - Diaphragmatic Breathing at 90/90 Supported  - 1 x daily - 7 x weekly - 2 sets - 10 reps - Child's Pose Knees Apart and Hands Forward   - 1 x daily - 7 x weekly - 2 sets - hold  Patient Education - Get To Know Your Pelvic Floor- Female  ASSESSMENT:  CLINICAL  IMPRESSION: Patient is a 48 y.o. female  who was seen today for physical therapy evaluation and treatment for rectal muscle spasms. She started having this sensation a few years ago, and hemorrhoid involvement has been ruled out. She will feel this pain with sitting for extended amounts of time and with bowel movements/straining. Patient fully consents to today's internal rectal examination. She demonstrates increased muscle tone at internal and external anal sphincter, limiting depth of examination due to tension levels. She has palpable trigger points throughout the rectum and was in discomfort during  the exam with movement in the rectal canal, similar to pain she feels when she has a flare. Levator ani bilaterally was very tense to palpation. With deep diaphragmatic breathing, patient was able to release a small amount of tension around the rectum in sidelying. HEP provided with downtraining and diaphragmatic breathing exercises to begin relaxing her pelvic floor. Overall, pt tolerated evaluation well and Pt would benefit from additional PT to further address deficits.    OBJECTIVE IMPAIRMENTS: decreased coordination, decreased endurance, decreased mobility, decreased ROM, decreased strength, and pain.   ACTIVITY LIMITATIONS: sitting, continence, and toileting  PARTICIPATION LIMITATIONS: community activity and toileting   PERSONAL FACTORS: Past/current experiences and Time since onset of injury/illness/exacerbation are also affecting patient's functional outcome.   REHAB POTENTIAL: Good  CLINICAL DECISION MAKING: Stable/uncomplicated  EVALUATION COMPLEXITY: Low   GOALS: Goals reviewed with patient? Yes  SHORT TERM GOALS: Target date: 05/01/2024  Pt will be independent with HEP.  Baseline: Goal status: INITIAL  2.  Pt will be independent with diaphragmatic breathing and down training activities in order to improve pelvic floor relaxation. Baseline:  Goal status: INITIAL  3.  Pt will  be independent with use of squatty potty, relaxed toileting mechanics, and improved bowel movement techniques in order to increase ease of bowel movements and complete evacuation.  Baseline:  Goal status: INITIAL  LONG TERM GOALS: Target date: 10/01/2024  Pt will be independent with advanced HEP.  Baseline:  Goal status: INITIAL  2.  Pt to demonstrate improved coordination of pelvic floor and breathing mechanics with 10# squat with appropriate synergistic patterns to decrease pain at least 75% of the time for improved ability to complete a 30 minute workout with strain at pelvic floor and symptoms.   Baseline:  Goal status: INITIAL  3.  Pt will report 75% reduction of pain due to improvements in posture, strength, and muscle length in posterior aspect of pelvic floor to improve quality of life and decrease rectal spasms. Baseline:  Goal status: INITIAL  PLAN:  PT FREQUENCY: 1-2x/week  PT DURATION: 6 months  PLANNED INTERVENTIONS: 97110-Therapeutic exercises, 97530- Therapeutic activity, 97112- Neuromuscular re-education, 97535- Self Care, [id number]- Manual therapy, Patient/Family education, Taping, Joint mobilization, Spinal mobilization, Scar mobilization, Cryotherapy, and Moist heat  PLAN FOR NEXT SESSION: continued pelvic floor downtraining in varying positions, internal treatment to decrease tension around glutes and rectum, core strengthening and manual to low back and glutes   Celena JAYSON Domino, PT 04/03/2024, 4:15 PM

## 2024-04-09 ENCOUNTER — Encounter (HOSPITAL_BASED_OUTPATIENT_CLINIC_OR_DEPARTMENT_OTHER): Payer: Self-pay

## 2024-04-09 ENCOUNTER — Ambulatory Visit (HOSPITAL_BASED_OUTPATIENT_CLINIC_OR_DEPARTMENT_OTHER)
Admission: RE | Admit: 2024-04-09 | Discharge: 2024-04-09 | Disposition: A | Discharge status: Home / Self Care | Source: Ambulatory Visit | Attending: Obstetrics and Gynecology | Admitting: Obstetrics and Gynecology

## 2024-04-09 DIAGNOSIS — Z1231 Encounter for screening mammogram for malignant neoplasm of breast: Secondary | ICD-10-CM | POA: Diagnosis present

## 2024-05-23 ENCOUNTER — Telehealth: Payer: Self-pay | Admitting: Physical Therapy

## 2024-05-23 ENCOUNTER — Ambulatory Visit: Attending: General Surgery | Admitting: Physical Therapy

## 2024-05-23 DIAGNOSIS — M62838 Other muscle spasm: Secondary | ICD-10-CM | POA: Insufficient documentation

## 2024-05-23 DIAGNOSIS — R293 Abnormal posture: Secondary | ICD-10-CM | POA: Insufficient documentation

## 2024-05-23 DIAGNOSIS — R279 Unspecified lack of coordination: Secondary | ICD-10-CM | POA: Insufficient documentation

## 2024-05-23 NOTE — Telephone Encounter (Signed)
 Patient informed of missed appt today at 4:15 pm. Pt informed that if she no shows or cancels another visit, we will have to make her appts on a waitlist-only basis.  Celena Domino, PT, DPT 05/23/24 4:32 PM [facility] [address] [place], [place] [address] Phone # [phone] Fax [phone]

## 2024-06-06 ENCOUNTER — Telehealth: Payer: Self-pay | Admitting: Physical Therapy

## 2024-06-06 ENCOUNTER — Ambulatory Visit: Attending: General Surgery | Admitting: Physical Therapy

## 2024-06-06 DIAGNOSIS — M62838 Other muscle spasm: Secondary | ICD-10-CM | POA: Insufficient documentation

## 2024-06-06 DIAGNOSIS — R293 Abnormal posture: Secondary | ICD-10-CM | POA: Insufficient documentation

## 2024-06-06 DIAGNOSIS — R279 Unspecified lack of coordination: Secondary | ICD-10-CM | POA: Insufficient documentation

## 2024-06-06 NOTE — Telephone Encounter (Signed)
 Pt informed of missed visit today 06/06/24 at 4:15 pm. Pt informed of next scheduled appt and advised to call regarding any changes.  Celena Domino, PT, DPT 06/06/24 4:46 PM

## 2024-06-20 ENCOUNTER — Ambulatory Visit: Admitting: Physical Therapy

## 2024-06-20 ENCOUNTER — Encounter: Payer: Self-pay | Admitting: Physical Therapy

## 2024-07-03 ENCOUNTER — Ambulatory Visit

## 2024-07-03 ENCOUNTER — Encounter: Admitting: Physical Therapy

## 2024-07-03 VITALS — BP 96/45 | HR 69 | Ht 63.0 in | Wt 119.1 lb

## 2024-07-03 DIAGNOSIS — R399 Unspecified symptoms and signs involving the genitourinary system: Secondary | ICD-10-CM

## 2024-07-03 LAB — POCT URINALYSIS DIPSTICK
Bilirubin, UA: NEGATIVE
Glucose, UA: NEGATIVE
Ketones, UA: NEGATIVE
Nitrite, UA: NEGATIVE
Protein, UA: NEGATIVE
Spec Grav, UA: 1.01
Urobilinogen, UA: 0.2 U/dL
pH, UA: 6.5

## 2024-07-03 NOTE — Progress Notes (Signed)
 SUBJECTIVE: Margaret Duncan is a 48 y.o. female who complains of urinary frequency, urgency and dysuria x 7 days, without flank pain, fever, chills, or abnormal vaginal discharge or bleeding.   OBJECTIVE: Appears well, in no apparent distress.  Vital signs are normal. Urine dipstick shows positive for WBC's and RBC's.  ASSESSMENT: Dysuria  PLAN: Treatment per orders.  Call or return to clinic prn if these symptoms worsen or fail to improve as anticipated.  Jilene Spohr l Akira Perusse, CMA [facility] Student

## 2024-07-05 LAB — URINE CULTURE

## 2024-07-17 ENCOUNTER — Encounter: Admitting: Physical Therapy

## 2024-07-31 ENCOUNTER — Encounter: Admitting: Physical Therapy

## 2024-10-03 ENCOUNTER — Ambulatory Visit: Admitting: Student
